# Patient Record
Sex: Female | Born: 1995 | Race: White | Hispanic: No | Marital: Married | Smoking: Never smoker
Health system: Southern US, Community
[De-identification: ages and names within clinical notes are randomized; demographics above are authoritative.]

## PROBLEM LIST (undated history)

## (undated) DIAGNOSIS — K219 Gastro-esophageal reflux disease without esophagitis: Secondary | ICD-10-CM

## (undated) DIAGNOSIS — F909 Attention-deficit hyperactivity disorder, unspecified type: Secondary | ICD-10-CM

## (undated) DIAGNOSIS — E079 Disorder of thyroid, unspecified: Secondary | ICD-10-CM

## (undated) HISTORY — DX: Attention-deficit hyperactivity disorder, unspecified type: F90.9

## (undated) HISTORY — DX: Gastro-esophageal reflux disease without esophagitis: K21.9

## (undated) HISTORY — PX: WISDOM TOOTH EXTRACTION: SHX21

## (undated) HISTORY — DX: Disorder of thyroid, unspecified: E07.9

---
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
## 2015-03-07 ENCOUNTER — Emergency Department (HOSPITAL_COMMUNITY)
Admission: EM | Admit: 2015-03-07 | Discharge: 2015-03-07 | Disposition: A | Discharge status: Home / Self Care | Payer: BLUE CROSS/BLUE SHIELD | Attending: Emergency Medicine | Admitting: Emergency Medicine

## 2015-03-07 ENCOUNTER — Encounter (HOSPITAL_COMMUNITY): Payer: Self-pay

## 2015-03-07 ENCOUNTER — Emergency Department (HOSPITAL_COMMUNITY): Payer: BLUE CROSS/BLUE SHIELD

## 2015-03-07 DIAGNOSIS — Y92410 Unspecified street and highway as the place of occurrence of the external cause: Secondary | ICD-10-CM | POA: Insufficient documentation

## 2015-03-07 DIAGNOSIS — Y998 Other external cause status: Secondary | ICD-10-CM | POA: Diagnosis not present

## 2015-03-07 DIAGNOSIS — S8992XA Unspecified injury of left lower leg, initial encounter: Secondary | ICD-10-CM | POA: Diagnosis not present

## 2015-03-07 DIAGNOSIS — S80811A Abrasion, right lower leg, initial encounter: Secondary | ICD-10-CM | POA: Diagnosis not present

## 2015-03-07 DIAGNOSIS — Y9389 Activity, other specified: Secondary | ICD-10-CM | POA: Diagnosis not present

## 2015-03-07 DIAGNOSIS — S29001A Unspecified injury of muscle and tendon of front wall of thorax, initial encounter: Secondary | ICD-10-CM | POA: Diagnosis not present

## 2015-03-07 DIAGNOSIS — T148XXA Other injury of unspecified body region, initial encounter: Secondary | ICD-10-CM

## 2015-03-07 DIAGNOSIS — R0789 Other chest pain: Secondary | ICD-10-CM

## 2015-03-07 MED ORDER — IBUPROFEN 600 MG PO TABS: 600.0000 mg | ORAL_TABLET | Freq: Three times a day (TID) | ORAL | Status: DC
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
## 2015-03-07 NOTE — Discharge Instructions (Signed)
Abrasion °An abrasion is a cut or scrape of the skin. Abrasions do not extend through all layers of the skin and most heal within 10 days. It is important to care for your abrasion properly to prevent infection. °CAUSES  °Most abrasions are caused by falling on, or gliding across, the ground or other surface. When your skin rubs on something, the outer and inner layer of skin rubs off, causing an abrasion. °DIAGNOSIS  °Your caregiver will be able to diagnose an abrasion during a physical exam.  °TREATMENT  °Your treatment depends on how large and deep the abrasion is. Generally, your abrasion will be cleaned with water and a mild soap to remove any dirt or debris. An antibiotic ointment may be put over the abrasion to prevent an infection. A bandage (dressing) may be wrapped around the abrasion to keep it from getting dirty.  °You may need a tetanus shot if: °· You cannot remember when you had your last tetanus shot. °· You have never had a tetanus shot. °· The injury broke your skin. °If you get a tetanus shot, your arm may swell, get red, and feel warm to the touch. This is common and not a problem. If you need a tetanus shot and you choose not to have one, there is a rare chance of getting tetanus. Sickness from tetanus can be serious.  °HOME CARE INSTRUCTIONS  °· If a dressing was applied, change it at least once a day or as directed by your caregiver. If the bandage sticks, soak it off with warm water.   °· Wash the area with water and a mild soap to remove all the ointment 2 times a day. Rinse off the soap and pat the area dry with a clean towel.   °· Reapply any ointment as directed by your caregiver. This will help prevent infection and keep the bandage from sticking. Use gauze over the wound and under the dressing to help keep the bandage from sticking.   °· Change your dressing right away if it becomes wet or dirty.   °· Only take over-the-counter or prescription medicines for pain, discomfort, or fever as  directed by your caregiver.   °· Follow up with your caregiver within 24-48 hours for a wound check, or as directed. If you were not given a wound-check appointment, look closely at your abrasion for redness, swelling, or pus. These are signs of infection. °SEEK IMMEDIATE MEDICAL CARE IF:  °· You have increasing pain in the wound.   °· You have redness, swelling, or tenderness around the wound.   °· You have pus coming from the wound.   °· You have a fever or persistent symptoms for more than 2-3 days. °· You have a fever and your symptoms suddenly get worse. °· You have a bad smell coming from the wound or dressing.   °MAKE SURE YOU:  °· Understand these instructions. °· Will watch your condition. °· Will get help right away if you are not doing well or get worse. °Document Released: 05/25/2005 Document Revised: 08/01/2012 Document Reviewed: 07/19/2011 °ExitCare® Patient Information ©2015 [employer]. This information is not intended to replace advice given to you by your health care provider. Make sure you discuss any questions you have with your health care provider. °Motor Vehicle Collision °It is common to have multiple bruises and sore muscles after a motor vehicle collision (MVC). These tend to feel worse for the first 24 hours. You may have the most stiffness and soreness over the first several hours. You may also feel   worse when you wake up the first morning after your collision. After this point, you will usually begin to improve with each day. The speed of improvement often depends on the severity of the collision, the number of injuries, and the location and nature of these injuries. °HOME CARE INSTRUCTIONS °· Put ice on the injured area. °¨ Put ice in a plastic bag. °¨ Place a towel between your skin and the bag. °¨ Leave the ice on for 15-20 minutes, 3-4 times a day, or as directed by your health care provider. °· Drink enough fluids to keep your urine clear or pale yellow. Do not drink  alcohol. °· Take a warm shower or bath once or twice a day. This will increase blood flow to sore muscles. °· You may return to activities as directed by your caregiver. Be careful when lifting, as this may aggravate neck or back pain. °· Only take over-the-counter or prescription medicines for pain, discomfort, or fever as directed by your caregiver. Do not use aspirin. This may increase bruising and bleeding. °SEEK IMMEDIATE MEDICAL CARE IF: °· You have numbness, tingling, or weakness in the arms or legs. °· You develop severe headaches not relieved with medicine. °· You have severe neck pain, especially tenderness in the middle of the back of your neck. °· You have changes in bowel or bladder control. °· There is increasing pain in any area of the body. °· You have shortness of breath, light-headedness, dizziness, or fainting. °· You have chest pain. °· You feel sick to your stomach (nauseous), throw up (vomit), or sweat. °· You have increasing abdominal discomfort. °· There is blood in your urine, stool, or vomit. °· You have pain in your shoulder (shoulder strap areas). °· You feel your symptoms are getting worse. °MAKE SURE YOU: °· Understand these instructions. °· Will watch your condition. °· Will get help right away if you are not doing well or get worse. °Document Released: 08/15/2005 Document Revised: 12/30/2013 Document Reviewed: 01/12/2011 °ExitCare® Patient Information ©2015 [employer]. This information is not intended to replace advice given to you by your health care provider. Make sure you discuss any questions you have with your health care provider. ° °

## 2015-03-07 NOTE — ED Notes (Signed)
Pt ambulatory on discharge. Verbalizes understanding of d/c instructions. A/O x4. VSS.
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
## 2015-03-07 NOTE — ED Notes (Signed)
Pt back from x-ray.

## 2015-03-14 ENCOUNTER — Ambulatory Visit (INDEPENDENT_AMBULATORY_CARE_PROVIDER_SITE_OTHER): Payer: BLUE CROSS/BLUE SHIELD | Admitting: Family Medicine

## 2015-03-14 DIAGNOSIS — S20212A Contusion of left front wall of thorax, initial encounter: Secondary | ICD-10-CM | POA: Diagnosis not present

## 2015-03-14 DIAGNOSIS — S8001XA Contusion of right knee, initial encounter: Secondary | ICD-10-CM | POA: Diagnosis not present
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
## 2015-03-14 NOTE — Patient Instructions (Signed)
Continue the ibuprofen. It's reasonable to consult a chiropractor because of all the strain on her ribs and chest.  I've written a note for you to be out another week until these strains and contusions can heel

## 2015-06-29 ENCOUNTER — Encounter: Payer: Self-pay | Admitting: Primary Care

## 2015-06-29 ENCOUNTER — Encounter (INDEPENDENT_AMBULATORY_CARE_PROVIDER_SITE_OTHER): Payer: Self-pay

## 2015-06-29 ENCOUNTER — Ambulatory Visit (INDEPENDENT_AMBULATORY_CARE_PROVIDER_SITE_OTHER): Payer: BLUE CROSS/BLUE SHIELD | Admitting: Primary Care

## 2015-06-29 VITALS — BP 112/68 | HR 89 | Temp 98.2°F | Ht 62.0 in | Wt 120.4 lb

## 2015-06-29 DIAGNOSIS — Z7689 Persons encountering health services in other specified circumstances: Secondary | ICD-10-CM

## 2015-06-29 DIAGNOSIS — Z7189 Other specified counseling: Secondary | ICD-10-CM | POA: Diagnosis not present

## 2015-06-29 NOTE — Patient Instructions (Signed)
I recommend taking Women's One-A-Day Vitamins for everyday health.  Please schedule a physical with me at your convenience in the next several months.  It was a pleasure to meet you today! Please don't hesitate to call me with any questions. Welcome to [facility]!

## 2015-06-29 NOTE — Progress Notes (Signed)
   Subjective:    Patient ID: Amber Murillo, female    DOB: 01/28/1996, 19 y.o.   MRN: [id number]  HPI  Ms. Amber Murillo is a 19 year old female who presents today to establish care. Her last physical was several years ago. She see's her GYN annually and last visit was in April 2016. She has no complaints today.   Review of Systems  Constitutional: Negative for unexpected weight change.  HENT: Negative for rhinorrhea.   Respiratory: Negative for cough and shortness of breath.   Cardiovascular: Negative for chest pain.  Gastrointestinal: Negative for diarrhea and constipation.  Genitourinary: Negative for difficulty urinating.       Regular periods  Musculoskeletal: Negative for myalgias and arthralgias.  Skin: Negative for rash.       Will get heat rash after running.   Allergic/Immunologic: Positive for environmental allergies.  Neurological: Negative for dizziness, numbness and headaches.  Psychiatric/Behavioral:       Denies concerns for anxiety or depression       No past medical history on file.  Social History   Social History  . Marital Status: Single    Spouse Name: N/A  . Number of Children: N/A  . Years of Education: N/A   Occupational History  . Not on file.   Social History Main Topics  . Smoking status: Never Smoker   . Smokeless tobacco: Never Used  . Alcohol Use: No  . Drug Use: No  . Sexual Activity: Yes    Birth Control/ Protection: Pill   Other Topics Concern  . Not on file   Social History Narrative   Single.   No children.   Works as a Theme park manager.   Is in school at [employer] for PT.   Enjoys playing softball and riding horses.     No past surgical history on file.  No family history on file.  No Known Allergies  Current Outpatient Prescriptions on File Prior to Visit  Medication Sig Dispense Refill  . ibuprofen (ADVIL,MOTRIN) 600 MG tablet Take 1 tablet (600 mg total) by mouth 3 (three) times daily. 15 tablet 0  . Norethindrone  Acetate-Ethinyl Estradiol (JUNEL 1.5/30) 1.5-30 MG-MCG tablet Take 1 tablet by mouth daily.     No current facility-administered medications on file prior to visit.    BP 112/68 mmHg  Pulse 89  Temp(Src) 98.2 F (36.8 C) (Oral)  Ht 5' 2"  (1.575 m)  Wt 120 lb 6.4 oz (54.613 kg)  BMI 22.02 kg/m2  SpO2 98%  LMP 06/29/2015    Objective:   Physical Exam  Constitutional: She is oriented to person, place, and time. She appears well-nourished.  Cardiovascular: Normal rate and regular rhythm.   Pulmonary/Chest: Effort normal and breath sounds normal.  Neurological: She is alert and oriented to person, place, and time.  Skin: Skin is warm and dry.  Psychiatric: She has a normal mood and affect.          Assessment & Plan:  Establish Care:  19 year old female presents today to establish care. Overall healthy girl, no PMH or significant FH. Last physical was several years ago and she follows with GYN annually. Discussed to see me for physical in the next several months.

## 2015-06-29 NOTE — Progress Notes (Signed)
Pre visit review using our clinic review tool, if applicable. No additional management support is needed unless otherwise documented below in the visit note. 

## 2015-07-01 ENCOUNTER — Encounter: Payer: Self-pay | Admitting: Primary Care

## 2015-07-06 ENCOUNTER — Encounter: Payer: Self-pay | Admitting: Internal Medicine

## 2015-07-06 ENCOUNTER — Ambulatory Visit (INDEPENDENT_AMBULATORY_CARE_PROVIDER_SITE_OTHER): Payer: BLUE CROSS/BLUE SHIELD | Admitting: Internal Medicine

## 2015-07-06 VITALS — BP 124/78 | HR 110 | Temp 98.0°F | Wt 118.0 lb

## 2015-07-06 DIAGNOSIS — J0100 Acute maxillary sinusitis, unspecified: Secondary | ICD-10-CM | POA: Diagnosis not present

## 2015-07-06 MED ORDER — AMOXICILLIN 500 MG PO CAPS: 500.0000 mg | ORAL_CAPSULE | Freq: Three times a day (TID) | ORAL | Status: DC

## 2015-07-06 NOTE — Patient Instructions (Signed)

## 2015-07-06 NOTE — Progress Notes (Signed)
HPI  Pt presents to the clinic today with c/o headache, facial pain and pressure, nasal congestion, scratchy throat and cough. She reports this started 2 days ago. She is blowing green mucous out of her nose. The cough is nonproductive. She has run fever up to 101.9. She has taken Ibuprofen without much relief. She does have a history of seasonal allergies. She takes Zyrtec as needed. She has not had sick contacts.  Review of Systems   History reviewed. No pertinent past medical history.  History reviewed. No pertinent family history.  Social History   Social History  . Marital Status: Single    Spouse Name: N/A  . Number of Children: N/A  . Years of Education: N/A   Occupational History  . Not on file.   Social History Main Topics  . Smoking status: Never Smoker   . Smokeless tobacco: Never Used  . Alcohol Use: No  . Drug Use: No  . Sexual Activity: Yes    Birth Control/ Protection: Pill   Other Topics Concern  . Not on file   Social History Narrative   Single.   No children.   Works as a Theme park manager.   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   Enjoys playing softball and riding horses.     No Known Allergies   Constitutional: Positive headache, fatigue and fever. Denies abrupt weight changes.  HEENT:  Positive eye pain, facial pain, nasal congestion and sore throat. Denies eye redness, ear pain, ringing in the ears, wax buildup, runny nose or bloody nose. Respiratory: Positive cough. Denies difficulty breathing or shortness of breath.  Cardiovascular: Denies chest pain, chest tightness, palpitations or swelling in the hands or feet.   No other specific complaints in a complete review of systems (except as listed in HPI above).  Objective:   BP 124/78 mmHg  Pulse 110  Temp(Src) 98 F (36.7 C) (Oral)  Wt 118 lb (53.524 kg)  SpO2 98%  LMP 06/29/2015  General: Appears her stated age, well developed, well nourished in NAD. HEENT: Head: normal shape and size,  maxillary sinus tenderness noted; Eyes: sclera white, no icterus, conjunctiva pink; Ears: Tm's gray and intact, normal light reflex; Nose: mucosa boggy and moist, septum midline; Throat/Mouth: + PND. Teeth present, mucosa erythematous and moist, no exudate noted, no lesions or ulcerations noted.  Neck:  Cervical adenopathy noted.  Cardiovascular: Normal rate and rhythm. S1,S2 noted.  No murmur, rubs or gallops noted.  Pulmonary/Chest: Normal effort and positive vesicular breath sounds. No respiratory distress. No wheezes, rales or ronchi noted.      Assessment & Plan:   Acute viral sinusitis  Can use a Neti Pot which can be purchased from your local drug store. Flonase 2 sprays each nostril for 3 days and then as needed. Take Zyrtec daily Written RX for Amoxil 500 mg TID to start on Friday if no improvement  RTC as needed or if symptoms persist.

## 2015-07-06 NOTE — Progress Notes (Signed)
Pre visit review using our clinic review tool, if applicable. No additional management support is needed unless otherwise documented below in the visit note. 

## 2015-07-31 ENCOUNTER — Encounter: Payer: Self-pay | Admitting: Primary Care

## 2015-08-11 ENCOUNTER — Encounter: Payer: Self-pay | Admitting: Primary Care

## 2015-08-11 ENCOUNTER — Telehealth: Payer: Self-pay | Admitting: Primary Care

## 2015-08-11 ENCOUNTER — Ambulatory Visit (INDEPENDENT_AMBULATORY_CARE_PROVIDER_SITE_OTHER): Payer: BLUE CROSS/BLUE SHIELD | Admitting: Primary Care

## 2015-08-11 VITALS — BP 118/78 | HR 103 | Temp 97.9°F | Ht 62.0 in | Wt 121.0 lb

## 2015-08-11 DIAGNOSIS — R41840 Attention and concentration deficit: Secondary | ICD-10-CM

## 2015-08-11 DIAGNOSIS — F909 Attention-deficit hyperactivity disorder, unspecified type: Secondary | ICD-10-CM | POA: Insufficient documentation

## 2015-08-11 NOTE — Progress Notes (Signed)
   Subjective:    Patient ID: Amber Murillo, female    DOB: 10/27/1995, 19 y.o.   MRN: [id number]  HPI  Amber Murillo is a 19 year old female who presents today to discuss ADHD medication. She was diagnosed in first grade. She was on "low dose adderall" during first through fifth grade. She did well in middle and high school without medications. Over the past 2 semesters in college she's had little ability to concentrate, she's unable to complete homework and studying, and has increased test anxiety.  Her grades have been C's, D's, and F's. She failed her chemistry class this semester. Denies daily worry, sadness, tearfulness. She is interested in going back on medication.  Review of Systems  Cardiovascular: Negative for chest pain.       Occasional palpitations during exams.  Psychiatric/Behavioral: Positive for decreased concentration. Negative for suicidal ideas and sleep disturbance. The patient is not nervous/anxious.        No past medical history on file.  Social History   Social History  . Marital Status: Single    Spouse Name: N/A  . Number of Children: N/A  . Years of Education: N/A   Occupational History  . Not on file.   Social History Main Topics  . Smoking status: Never Smoker   . Smokeless tobacco: Never Used  . Alcohol Use: No  . Drug Use: No  . Sexual Activity: Yes    Birth Control/ Protection: Pill   Other Topics Concern  . Not on file   Social History Narrative   Single.   No children.   Works as a Theme park manager.   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   Enjoys playing softball and riding horses.     No past surgical history on file.  No family history on file.  No Known Allergies  Current Outpatient Prescriptions on File Prior to Visit  Medication Sig Dispense Refill  . ibuprofen (ADVIL,MOTRIN) 600 MG tablet Take 1 tablet (600 mg total) by mouth 3 (three) times daily. 15 tablet 0  . Norethindrone Acetate-Ethinyl Estradiol (JUNEL 1.5/30) 1.5-30 MG-MCG  tablet Take 1 tablet by mouth daily.    . Probiotic Product (PROBIOTIC DAILY) CAPS Take 1 capsule by mouth at bedtime.     No current facility-administered medications on file prior to visit.    BP 118/78 mmHg  Pulse 103  Temp(Src) 97.9 F (36.6 C) (Oral)  Ht 5' 2"  (1.575 m)  Wt 121 lb (54.885 kg)  BMI 22.13 kg/m2  SpO2 99%  LMP 07/31/2015    Objective:   Physical Exam  Constitutional: She appears well-nourished.  Cardiovascular: Normal rate and regular rhythm.   Pulmonary/Chest: Effort normal and breath sounds normal.  Skin: Skin is warm and dry.  Psychiatric: She has a normal mood and affect.  Tearful as she is upset from failing her class this semester.          Assessment & Plan:

## 2015-08-11 NOTE — Assessment & Plan Note (Signed)
History of ADHD as a child. Managed on "low dose adderall" during first through fifth grade, no meds since. Decreased concentration, test anxiety, failing tests, failed 1 class this semester.  Is interested in going back on meds. Does not appear to have underlying anxiety and depression. Will send her to psychology for formal testing and recommendations. Will treat accordingly.

## 2015-08-11 NOTE — Progress Notes (Signed)
Pre visit review using our clinic review tool, if applicable. No additional management support is needed unless otherwise documented below in the visit note. 

## 2015-08-11 NOTE — Telephone Encounter (Signed)
Patient called me to ask if she can get a Dr's note from you. Basically, her Financial Aid is in jeopardy because of her bad grades. [employer] told her to obtain a Dr's note stating the plan that you have for your patient and how it will affect and improve her grades with your plan. I sent a message to [facility] to schedule with Dr Irven Shelling. They were sending a message to Dr Glennon Hamilton to ask if she would see her urgently. If you have any questiond you can reach the patient at [phone]. She can come by to pick up her letter from you. Please call patient for more details.

## 2015-08-11 NOTE — Patient Instructions (Signed)
Stop by the front and speak with Rosaria Ferries regarding your referral to [facility] for ADHD testing.  Please call me once you've completed testing so I can ensure we get you initiated on the correct medication promptly.   It was a pleasure to see you today!

## 2015-08-11 NOTE — Telephone Encounter (Signed)
Noted. Discussed with patient and will work on Engineer, building services.

## 2015-09-11 ENCOUNTER — Ambulatory Visit (INDEPENDENT_AMBULATORY_CARE_PROVIDER_SITE_OTHER): Payer: BLUE CROSS/BLUE SHIELD | Admitting: Psychology

## 2015-09-11 DIAGNOSIS — F4322 Adjustment disorder with anxiety: Secondary | ICD-10-CM

## 2015-09-15 ENCOUNTER — Encounter: Payer: Self-pay | Admitting: Primary Care

## 2015-09-21 ENCOUNTER — Other Ambulatory Visit: Payer: Self-pay | Admitting: Primary Care

## 2015-09-21 ENCOUNTER — Encounter: Payer: Self-pay | Admitting: Primary Care

## 2015-09-21 DIAGNOSIS — F900 Attention-deficit hyperactivity disorder, predominantly inattentive type: Secondary | ICD-10-CM

## 2015-09-21 MED ORDER — AMPHETAMINE-DEXTROAMPHET ER 10 MG PO CP24: 10.0000 mg | ORAL_CAPSULE | ORAL | Status: DC

## 2015-09-23 ENCOUNTER — Encounter: Payer: Self-pay | Admitting: Primary Care

## 2015-10-01 ENCOUNTER — Ambulatory Visit (INDEPENDENT_AMBULATORY_CARE_PROVIDER_SITE_OTHER): Payer: BLUE CROSS/BLUE SHIELD | Admitting: Psychology

## 2015-10-01 DIAGNOSIS — F902 Attention-deficit hyperactivity disorder, combined type: Secondary | ICD-10-CM

## 2015-10-13 ENCOUNTER — Other Ambulatory Visit: Payer: Self-pay | Admitting: *Deleted

## 2015-10-13 DIAGNOSIS — F900 Attention-deficit hyperactivity disorder, predominantly inattentive type: Secondary | ICD-10-CM

## 2015-10-13 MED ORDER — AMPHETAMINE-DEXTROAMPHET ER 10 MG PO CP24: 10.0000 mg | ORAL_CAPSULE | ORAL | Status: DC

## 2015-10-13 NOTE — Telephone Encounter (Signed)
Called patient and notified her that prescription is ready for pick up. Left in front office.

## 2015-10-13 NOTE — Telephone Encounter (Signed)
Last filled #30 09/21/15 (first prescription).  Follow up scheduled for 10/22/15.  Okay to refill?

## 2015-10-18 ENCOUNTER — Encounter: Payer: Self-pay | Admitting: Primary Care

## 2015-10-20 ENCOUNTER — Encounter: Payer: Self-pay | Admitting: Primary Care

## 2015-10-20 ENCOUNTER — Ambulatory Visit (INDEPENDENT_AMBULATORY_CARE_PROVIDER_SITE_OTHER): Payer: BLUE CROSS/BLUE SHIELD | Admitting: Primary Care

## 2015-10-20 VITALS — BP 116/76 | HR 93 | Temp 97.9°F | Ht 62.0 in | Wt 121.4 lb

## 2015-10-20 DIAGNOSIS — F908 Attention-deficit hyperactivity disorder, other type: Secondary | ICD-10-CM | POA: Diagnosis not present

## 2015-10-20 DIAGNOSIS — N939 Abnormal uterine and vaginal bleeding, unspecified: Secondary | ICD-10-CM

## 2015-10-20 LAB — POCT URINE PREGNANCY: Preg Test, Ur: NEGATIVE

## 2015-10-20 NOTE — Patient Instructions (Addendum)
Please notify me if you continue to bleed by Wednesday next week. Start your new pack of birth control pills as scheduled this Sunday.  Continue your Adderall XR tablets for ADHD.   Follow up in 3 months for re-evaluation.  It was a pleasure to see you today!

## 2015-10-20 NOTE — Progress Notes (Signed)
Subjective:    Patient ID: Amber Murillo, female    DOB: April 17, 1996, 20 y.o.   MRN: [id number]  HPI  Amber Murillo is a 20 year old female who presents today for follow up of ADHD and a chief complaint of vaginal bleeding.  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 It was also suggested that her IQ was slightly below goal during her testing with psychology. She has always had a difficult time in school as a child. She was initiated on Adderall XR 10 mg tablets one month ago for her new diagnosis. Since her last visit she's felt significantly improved. He has more attention and concentration with her school work and is able to complete assignments. Denies palpitations, chest pain, dizziness.   2) Vaginal Bleeding: Last normal period began 09/18/15 and ended about 7 days later. Her bleeding began again on 10/09/15 with light spotting and has persisted to a regular period since. Her prior LMP began on 09/18/15 and lasted for 7 days. She's not missed any pills of her birth control pack. Denies pain, increased cramping, passing clots, fatigue, weakness, dizziness, nausea/vomiting, vaginal bleeding. This has never occurred in the past and she's not taken a home pregnancy test.   Review of Systems  Constitutional: Negative for fever.  Respiratory: Negative for shortness of breath.   Cardiovascular: Negative for chest pain.  Genitourinary: Positive for vaginal bleeding and menstrual problem. Negative for dysuria, frequency, vaginal discharge, vaginal pain and pelvic pain.  Neurological: Negative for dizziness and weakness.  Psychiatric/Behavioral: Negative for decreased concentration. The patient is not nervous/anxious.        No past medical history on file.  Social History   Social History  . Marital Status: Single    Spouse Name: N/A  . Number of Children: N/A  . Years of Education: N/A   Occupational History  . Not on file.   Social History Main Topics   . Smoking status: Never Smoker   . Smokeless tobacco: Never Used  . Alcohol Use: No  . Drug Use: No  . Sexual Activity: Yes    Birth Control/ Protection: Pill   Other Topics Concern  . Not on file   Social History Narrative   Single.   No children.   Works as a Theme park manager.   Is in school at [employer] for PT.   Enjoys playing softball and riding horses.     No past surgical history on file.  No family history on file.  No Known Allergies  Current Outpatient Prescriptions on File Prior to Visit  Medication Sig Dispense Refill  . amphetamine-dextroamphetamine (ADDERALL XR) 10 MG 24 hr capsule Take 1 capsule (10 mg total) by mouth every morning. 30 capsule 0  . ibuprofen (ADVIL,MOTRIN) 600 MG tablet Take 1 tablet (600 mg total) by mouth 3 (three) times daily. 15 tablet 0  . Probiotic Product (PROBIOTIC DAILY) CAPS Take 1 capsule by mouth at bedtime.    . Norethindrone Acetate-Ethinyl Estradiol (JUNEL 1.5/30) 1.5-30 MG-MCG tablet Take 1 tablet by mouth daily. Reported on 10/20/2015     No current facility-administered medications on file prior to visit.    BP 116/76 mmHg  Pulse 93  Temp(Src) 97.9 F (36.6 C) (Oral)  Ht 5' 2"  (1.575 m)  Wt 121 lb 6.4 oz (55.067 kg)  BMI 22.20 kg/m2  SpO2 99%  LMP 10/09/2015    Objective:   Physical Exam  Constitutional: She appears well-nourished.  Cardiovascular: Normal  rate and regular rhythm.   Pulmonary/Chest: Effort normal and breath sounds normal.  Abdominal: Soft. Bowel sounds are normal. There is no tenderness.  Skin: Skin is warm and dry. No pallor.  Psychiatric: She has a normal mood and affect.          Assessment & Plan:  Vaginal Bleeding:  Since 10/09/15 and continuing. No cramping, passing clots, weakness, abdominal pain. No missed pills. LMP was 09/18/15 and was normal for patient. She is currently on her scheduled menstrual cycle now and will start back on pills Sunday this week. Will have her start pills as  scheduled and then notify me if her bleeding continues. Urine pregnancy negative today. If continues, then will consider ultrasound. Does not appear weak or anemic today. Will continue to monitor. This is not a known side effect of her newly prescribed Adderall.

## 2015-10-20 NOTE — Addendum Note (Signed)
Addended by: Jacqualin Combes on: 10/20/2015 09:34 AM   Modules accepted: Orders

## 2015-10-20 NOTE — Assessment & Plan Note (Signed)
Formally tested and determined to be in partial remission. Testing also revealed decrease in IQ. Currently managed on Adderall XR 10 mg and feels much improved. Continue same, follow up in May 2017.

## 2015-10-20 NOTE — Progress Notes (Signed)
Pre visit review using our clinic review tool, if applicable. No additional management support is needed unless otherwise documented below in the visit note. 

## 2015-10-22 ENCOUNTER — Ambulatory Visit: Payer: BLUE CROSS/BLUE SHIELD | Admitting: Primary Care

## 2015-10-30 ENCOUNTER — Encounter: Payer: Self-pay | Admitting: Primary Care

## 2015-11-16 ENCOUNTER — Other Ambulatory Visit: Payer: Self-pay | Admitting: Primary Care

## 2015-11-16 DIAGNOSIS — F909 Attention-deficit hyperactivity disorder, unspecified type: Secondary | ICD-10-CM

## 2015-11-16 MED ORDER — AMPHETAMINE-DEXTROAMPHET ER 10 MG PO CP24: 10.0000 mg | ORAL_CAPSULE | Freq: Every day | ORAL | Status: DC

## 2015-11-16 NOTE — Telephone Encounter (Signed)
Electronically refill request for   amphetamine-dextroamphetamine (ADDERALL XR) 10 MG 24 hr capsule   Take 1 capsule (10 mg total) by mouth every morning.  Dispense: 30 capsule   Refills: 0     Last prescribed on  10/13/2015. Last seen on 10/20/2015. CPE on 12/28/2015.

## 2015-11-17 ENCOUNTER — Encounter: Payer: Self-pay | Admitting: Primary Care

## 2015-12-03 ENCOUNTER — Encounter: Payer: Self-pay | Admitting: Primary Care

## 2015-12-04 ENCOUNTER — Encounter: Payer: Self-pay | Admitting: Primary Care

## 2015-12-14 ENCOUNTER — Other Ambulatory Visit: Payer: Self-pay | Admitting: Primary Care

## 2015-12-14 DIAGNOSIS — F909 Attention-deficit hyperactivity disorder, unspecified type: Secondary | ICD-10-CM

## 2015-12-17 ENCOUNTER — Other Ambulatory Visit: Payer: Self-pay | Admitting: Primary Care

## 2015-12-17 NOTE — Telephone Encounter (Signed)
Electronically refill request for    amphetamine-dextroamphetamine (ADDERALL XR) 10 MG 24 hr capsule  Take 1 capsule (10 mg total) by mouth daily.  Dispense: 30    Refill:  0  Last prescribed on 11/16/2015. Last seen on 10/19/2014. CPE on 01/12/2016.

## 2015-12-22 ENCOUNTER — Encounter: Payer: Self-pay | Admitting: Primary Care

## 2015-12-23 ENCOUNTER — Other Ambulatory Visit: Payer: BLUE CROSS/BLUE SHIELD

## 2015-12-28 ENCOUNTER — Encounter: Payer: BLUE CROSS/BLUE SHIELD | Admitting: Primary Care

## 2015-12-29 ENCOUNTER — Encounter: Payer: BLUE CROSS/BLUE SHIELD | Admitting: Primary Care

## 2016-01-12 ENCOUNTER — Encounter: Payer: Self-pay | Admitting: Primary Care

## 2016-01-12 ENCOUNTER — Ambulatory Visit (INDEPENDENT_AMBULATORY_CARE_PROVIDER_SITE_OTHER): Payer: BLUE CROSS/BLUE SHIELD | Admitting: Primary Care

## 2016-01-12 VITALS — BP 116/64 | HR 91 | Temp 98.0°F | Ht 62.0 in | Wt 119.8 lb

## 2016-01-12 DIAGNOSIS — Z0000 Encounter for general adult medical examination without abnormal findings: Secondary | ICD-10-CM

## 2016-01-12 DIAGNOSIS — F908 Attention-deficit hyperactivity disorder, other type: Secondary | ICD-10-CM

## 2016-01-12 DIAGNOSIS — R21 Rash and other nonspecific skin eruption: Secondary | ICD-10-CM | POA: Diagnosis not present

## 2016-01-12 DIAGNOSIS — Z0001 Encounter for general adult medical examination with abnormal findings: Secondary | ICD-10-CM

## 2016-01-12 DIAGNOSIS — Z23 Encounter for immunization: Secondary | ICD-10-CM | POA: Diagnosis not present

## 2016-01-12 LAB — COMPREHENSIVE METABOLIC PANEL
ALT: 14 U/L (ref 0–35)
AST: 20 U/L (ref 0–37)
Albumin: 4.2 g/dL (ref 3.5–5.2)
Alkaline Phosphatase: 54 U/L (ref 39–117)
BUN: 10 mg/dL (ref 6–23)
CO2: 28 mEq/L (ref 19–32)
Calcium: 9.4 mg/dL (ref 8.4–10.5)
Chloride: 104 mEq/L (ref 96–112)
Creatinine, Ser: 0.77 mg/dL (ref 0.40–1.20)
GFR: 100.03 mL/min (ref >60.00)
Glucose, Bld: 73 mg/dL (ref 70–99)
Potassium: 3.7 mEq/L (ref 3.5–5.1)
Sodium: 139 mEq/L (ref 135–145)
Total Bilirubin: 0.6 mg/dL (ref 0.2–1.2)
Total Protein: 7 g/dL (ref 6.0–8.3)

## 2016-01-12 LAB — TSH: TSH: 1.49 u[IU]/mL (ref 0.35–4.50)

## 2016-01-12 NOTE — Progress Notes (Signed)
Subjective:    Patient ID: Amber Murillo, female    DOB: 04/05/1996, 20 y.o.   MRN: [id number]  HPI  Amber Murillo is a 20 year old female who presents today for complete physical.  Immunizations: -Tetanus: Completed in 2007, due today. -Influenza: Did not receive last season.  Diet: She endorses a healthy diet. Breakfast: Oatmeal with fruit Lunch: Sandwich, chips, left overs Dinner: Protein, vegetables, starches Snacks: Protein bars, fruit Desserts: Occasional Beverages: Water, occasionally sweet tea, rare sodas.  Exercise: She does exercise some days with running, walking.  Eye exam: Completed several years ago. Denies changes in vision.  Dental exam: Completes semi-annually. Pap Smear: Scheduled for June 2017 per GYN.   Review of Systems  Constitutional: Negative for unexpected weight change.  HENT: Negative for rhinorrhea.   Respiratory: Negative for cough and shortness of breath.   Cardiovascular: Negative for chest pain.  Gastrointestinal: Negative for diarrhea and constipation.  Genitourinary: Negative for difficulty urinating.       Regular periods  Musculoskeletal: Negative for myalgias and arthralgias.  Skin:       Breaks out in rash with exercising outdoors, appointment with allergy clinic in June  Allergic/Immunologic: Positive for environmental allergies.  Neurological: Negative for dizziness, numbness and headaches.  Psychiatric/Behavioral: Negative for suicidal ideas and decreased concentration. The patient is not nervous/anxious.        Denies anxiety and depression       No past medical history on file.   Social History   Social History  . Marital Status: Single    Spouse Name: N/A  . Number of Children: N/A  . Years of Education: N/A   Occupational History  . Not on file.   Social History Main Topics  . Smoking status: Never Smoker   . Smokeless tobacco: Never Used  . Alcohol Use: No  . Drug Use: No  . Sexual Activity: Yes    Birth  Control/ Protection: Pill   Other Topics Concern  . Not on file   Social History Narrative   Single.   No children.   Works as a Theme park manager.   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   Enjoys playing softball and riding horses.     No past surgical history on file.  No family history on file.  No Known Allergies  Current Outpatient Prescriptions on File Prior to Visit  Medication Sig Dispense Refill  . amphetamine-dextroamphetamine (ADDERALL XR) 10 MG 24 hr capsule TAKE ONE CAPSULE BY MOUTH EVERY MORNING 30 capsule 0  . amphetamine-dextroamphetamine (ADDERALL XR) 10 MG 24 hr capsule Take 1 capsule (10 mg total) by mouth daily. 30 capsule 0  . ibuprofen (ADVIL,MOTRIN) 600 MG tablet Take 1 tablet (600 mg total) by mouth 3 (three) times daily. 15 tablet 0  . Norethindrone Acetate-Ethinyl Estradiol (JUNEL 1.5/30) 1.5-30 MG-MCG tablet Take 1 tablet by mouth daily. Reported on 10/20/2015    . Probiotic Product (PROBIOTIC DAILY) CAPS Take 1 capsule by mouth at bedtime.     No current facility-administered medications on file prior to visit.    BP 116/64 mmHg  Pulse 91  Temp(Src) 98 F (36.7 C) (Oral)  Ht 5' 2"  (1.575 m)  Wt 119 lb 12.8 oz (54.341 kg)  BMI 21.91 kg/m2  SpO2 99%  LMP 01/09/2016    Objective:   Physical Exam  Constitutional: She is oriented to person, place, and time. She appears well-nourished.  HENT:  Right Ear: Tympanic membrane and ear canal normal.  Left Ear: Tympanic membrane and ear canal normal.  Nose: Nose normal.  Mouth/Throat: Oropharynx is clear and moist.  Eyes: Conjunctivae and EOM are normal. Pupils are equal, round, and reactive to light.  Neck: Neck supple. No thyromegaly present.  Cardiovascular: Normal rate and regular rhythm.   No murmur heard. Pulmonary/Chest: Effort normal and breath sounds normal. She has no rales.  Abdominal: Soft. Bowel sounds are normal. There is no tenderness.  Musculoskeletal: Normal range of motion.    Lymphadenopathy:    She has no cervical adenopathy.  Neurological: She is alert and oriented to person, place, and time. She has normal reflexes. No cranial nerve deficit.  Skin: Skin is warm and dry. No rash noted.  Psychiatric: She has a normal mood and affect.          Assessment & Plan:

## 2016-01-12 NOTE — Progress Notes (Signed)
Pre visit review using our clinic review tool, if applicable. No additional management support is needed unless otherwise documented below in the visit note. 

## 2016-01-12 NOTE — Patient Instructions (Addendum)
Stop Zyrtec. Start Xyzal for the rash and allergy symptoms.  Follow up with the Allergy Clinic as scheduled.  You've been provided with a tetanus vaccination which will cover you for 10 years.   Ensure you are consuming 64 ounces of water daily.  Continue exercising. You should be getting 1 hour of moderate intensity exercise 5 days weekly.  Complete lab work prior to leaving today. I will notify you of your results once received.   Call or e-mail me when you need a refill of your Adderall in August.   Follow up in 1 year for repeat physical or sooner if needed.  It was a pleasure to see you today!

## 2016-01-12 NOTE — Assessment & Plan Note (Signed)
Made A's and B's this past semester. Discussed Adderall use and she will refrain from taking this summer as she is not taking classes.  Will resume in August 2017.

## 2016-01-12 NOTE — Assessment & Plan Note (Signed)
With urticaria. Occurs when running/exercising outdoors.  Will have her stop Zyrtec, start Xyzal. She has an appointment with the allergy clinic in June.

## 2016-01-12 NOTE — Addendum Note (Signed)
Addended by: Jacqualin Combes on: 01/12/2016 04:19 PM   Modules accepted: Orders, SmartSet

## 2016-01-12 NOTE — Assessment & Plan Note (Signed)
Tdap due and provided today. Pap scheduled with GYN for June. Fair diet overall. Discussed to increase intake of water and vegetables. Continue exercising.  Exam unremarkable. Labs pending. Follow up in 1 year for repeat physical.

## 2016-01-29 ENCOUNTER — Encounter: Payer: Self-pay | Admitting: Primary Care

## 2016-02-02 ENCOUNTER — Encounter: Payer: Self-pay | Admitting: Primary Care

## 2016-02-02 DIAGNOSIS — J309 Allergic rhinitis, unspecified: Secondary | ICD-10-CM | POA: Diagnosis not present

## 2016-02-02 DIAGNOSIS — J3081 Allergic rhinitis due to animal (cat) (dog) hair and dander: Secondary | ICD-10-CM | POA: Diagnosis not present

## 2016-02-02 DIAGNOSIS — J3089 Other allergic rhinitis: Secondary | ICD-10-CM | POA: Diagnosis not present

## 2016-02-02 DIAGNOSIS — L509 Urticaria, unspecified: Secondary | ICD-10-CM | POA: Diagnosis not present

## 2016-02-02 DIAGNOSIS — H1045 Other chronic allergic conjunctivitis: Secondary | ICD-10-CM | POA: Diagnosis not present

## 2016-02-04 ENCOUNTER — Encounter: Payer: Self-pay | Admitting: Primary Care

## 2016-02-10 DIAGNOSIS — Z113 Encounter for screening for infections with a predominantly sexual mode of transmission: Secondary | ICD-10-CM | POA: Diagnosis not present

## 2016-02-10 DIAGNOSIS — Z6823 Body mass index (BMI) 23.0-23.9, adult: Secondary | ICD-10-CM | POA: Diagnosis not present

## 2016-02-10 DIAGNOSIS — Z01419 Encounter for gynecological examination (general) (routine) without abnormal findings: Secondary | ICD-10-CM | POA: Diagnosis not present

## 2016-04-01 ENCOUNTER — Other Ambulatory Visit: Payer: Self-pay | Admitting: Primary Care

## 2016-04-01 ENCOUNTER — Encounter: Payer: Self-pay | Admitting: Primary Care

## 2016-04-01 ENCOUNTER — Other Ambulatory Visit: Payer: Self-pay

## 2016-04-01 DIAGNOSIS — F909 Attention-deficit hyperactivity disorder, unspecified type: Secondary | ICD-10-CM

## 2016-04-01 MED ORDER — AMPHETAMINE-DEXTROAMPHET ER 10 MG PO CP24: 10.0000 mg | ORAL_CAPSULE | Freq: Every day | ORAL | 0 refills | Status: DC

## 2016-04-01 NOTE — Telephone Encounter (Signed)
Message left for patient to return my call.  

## 2016-04-01 NOTE — Telephone Encounter (Signed)
Pt left v/m requesting rx for Adderall. Call when ready for pick up. rx last printed # 30 x 1 on 11/16/15. Annual exam on 01/12/16.

## 2016-04-04 NOTE — Telephone Encounter (Signed)
Spoken and notified patient that prescription is ready for pick up.

## 2016-06-08 ENCOUNTER — Ambulatory Visit (HOSPITAL_COMMUNITY)
Admission: EM | Admit: 2016-06-08 | Discharge: 2016-06-08 | Discharge status: Absent without leave | Payer: BLUE CROSS/BLUE SHIELD

## 2016-06-09 ENCOUNTER — Ambulatory Visit: Payer: BLUE CROSS/BLUE SHIELD

## 2016-06-09 ENCOUNTER — Ambulatory Visit (INDEPENDENT_AMBULATORY_CARE_PROVIDER_SITE_OTHER): Payer: BLUE CROSS/BLUE SHIELD | Admitting: Family Medicine

## 2016-06-09 ENCOUNTER — Encounter: Payer: Self-pay | Admitting: Family Medicine

## 2016-06-09 VITALS — BP 108/80 | HR 94 | Temp 98.0°F | Ht 61.0 in | Wt 126.0 lb

## 2016-06-09 DIAGNOSIS — J0100 Acute maxillary sinusitis, unspecified: Secondary | ICD-10-CM

## 2016-06-09 DIAGNOSIS — J00 Acute nasopharyngitis [common cold]: Secondary | ICD-10-CM | POA: Diagnosis not present

## 2016-06-09 MED ORDER — PREDNISONE 20 MG PO TABS: 20.0000 mg | ORAL_TABLET | Freq: Every day | ORAL | 0 refills | Status: DC

## 2016-06-09 NOTE — Progress Notes (Signed)
   Subjective:    Patient ID: Amber Murillo, female    DOB: [birth year], 20 y.o.   MRN: [id number]  HPI This is a 20 yo female who presents today with 6-7 days of frontal headache and pain under her eyes. Pain comes and goes. Has been taking tylenol and ibuprofen. Over last couple of days she has developed muscle aches. Little nasal drainage. Has been using neti pot. Good fluid intake. Has seasonal allergies, worse in the fall. Sore throat today. No known sick contacts. Takes Zyrtec daily and alternates with mucinex. Feels tender over sinuses. No cough, SOB or wheeze.  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    Past Medical History:  Diagnosis Date  . ADHD (attention deficit hyperactivity disorder)    No past surgical history on file. No family history on file. Social History  Substance Use Topics  . Smoking status: Never Smoker  . Smokeless tobacco: Never Used  . Alcohol use No      Review of Systems Per HPI    Objective:   Physical Exam  Constitutional: She is oriented to person, place, and time. She appears well-developed and well-nourished. No distress.  HENT:  Head: Normocephalic and atraumatic.  Right Ear: Tympanic membrane, external ear and ear canal normal.  Left Ear: Tympanic membrane, external ear and ear canal normal.  Nose: Mucosal edema and rhinorrhea present. Right sinus exhibits maxillary sinus tenderness. Right sinus exhibits no frontal sinus tenderness. Left sinus exhibits maxillary sinus tenderness. Left sinus exhibits no frontal sinus tenderness.  Mouth/Throat: Uvula is midline, oropharynx is clear and moist and mucous membranes are normal.  Sounds congested. Nares small.    Eyes: Conjunctivae are normal.  Neck: Normal range of motion. Neck supple.  Cardiovascular: Normal rate, regular rhythm and normal heart sounds.   Pulmonary/Chest: Effort normal and breath sounds normal.  Lymphadenopathy:    She has no cervical adenopathy.  Neurological: She  is alert and oriented to person, place, and time.  Skin: Skin is warm and dry. She is not diaphoretic.  Psychiatric: She has a normal mood and affect. Her behavior is normal. Judgment and thought content normal.  Vitals reviewed.     BP 108/80   Pulse 94   Temp 98 F (36.7 C)   Ht 5' 1"  (1.549 m)   Wt 126 lb (57.2 kg)   LMP 06/01/2016   SpO2 98%   BMI 23.81 kg/m  Wt Readings from Last 3 Encounters:  06/09/16 126 lb (57.2 kg)  01/12/16 119 lb 12.8 oz (54.3 kg)  10/20/15 121 lb 6.4 oz (55.1 kg)       Assessment & Plan:  1. Acute maxillary sinusitis, recurrence not specified - likely viral in this 20 yo non smoking female with history of allergic rhinitis - hold Zyrtec for 3-4 days, may be contributing to overdrying, continue neti pot, can add Afrin x 3 days, sudafed - RTC precautions reviewed - predniSONE (DELTASONE) 20 MG tablet; Take 1 tablet (20 mg total) by mouth daily with breakfast.  Dispense: 5 tablet; Refill: 0   Clarene Reamer, FNP-BC  [facility]  06/09/2016 2:52 PM

## 2016-06-09 NOTE — Patient Instructions (Signed)
Hold your Zyrtec for 3-4 days Continue Neti pot several times a day Can use Afrin two sprays in each nostril at bedtime as needed No ibuprofen while on Prednisone   Sinusitis, Adult Sinusitis is redness, soreness, and inflammation of the paranasal sinuses. Paranasal sinuses are air pockets within the bones of your face. They are located beneath your eyes, in the middle of your forehead, and above your eyes. In healthy paranasal sinuses, mucus is able to drain out, and air is able to circulate through them by way of your nose. However, when your paranasal sinuses are inflamed, mucus and air can become trapped. This can allow bacteria and other germs to grow and cause infection. Sinusitis can develop quickly and last only a short time (acute) or continue over a long period (chronic). Sinusitis that lasts for more than 12 weeks is considered chronic. CAUSES Causes of sinusitis include:  Allergies.  Structural abnormalities, such as displacement of the cartilage that separates your nostrils (deviated septum), which can decrease the air flow through your nose and sinuses and affect sinus drainage.  Functional abnormalities, such as when the small hairs (cilia) that line your sinuses and help remove mucus do not work properly or are not present. SIGNS AND SYMPTOMS Symptoms of acute and chronic sinusitis are the same. The primary symptoms are pain and pressure around the affected sinuses. Other symptoms include:  Upper toothache.  Earache.  Headache.  Bad breath.  Decreased sense of smell and taste.  A cough, which worsens when you are lying flat.  Fatigue.  Fever.  Thick drainage from your nose, which often is green and may contain pus (purulent).  Swelling and warmth over the affected sinuses. DIAGNOSIS Your health care provider will perform a physical exam. During your exam, your health care provider may perform any of the following to help determine if you have acute sinusitis or  chronic sinusitis:  Look in your nose for signs of abnormal growths in your nostrils (nasal polyps).  Tap over the affected sinus to check for signs of infection.  View the inside of your sinuses using an imaging device that has a light attached (endoscope). If your health care provider suspects that you have chronic sinusitis, one or more of the following tests may be recommended:  Allergy tests.  Nasal culture. A sample of mucus is taken from your nose, sent to a lab, and screened for bacteria.  Nasal cytology. A sample of mucus is taken from your nose and examined by your health care provider to determine if your sinusitis is related to an allergy. TREATMENT Most cases of acute sinusitis are related to a viral infection and will resolve on their own within 10 days. Sometimes, medicines are prescribed to help relieve symptoms of both acute and chronic sinusitis. These may include pain medicines, decongestants, nasal steroid sprays, or saline sprays. However, for sinusitis related to a bacterial infection, your health care provider will prescribe antibiotic medicines. These are medicines that will help kill the bacteria causing the infection. Rarely, sinusitis is caused by a fungal infection. In these cases, your health care provider will prescribe antifungal medicine. For some cases of chronic sinusitis, surgery is needed. Generally, these are cases in which sinusitis recurs more than 3 times per year, despite other treatments. HOME CARE INSTRUCTIONS  Drink plenty of water. Water helps thin the mucus so your sinuses can drain more easily.  Use a humidifier.  Inhale steam 3-4 times a day (for example, sit in the bathroom  with the shower running).  Apply a warm, moist washcloth to your face 3-4 times a day, or as directed by your health care provider.  Use saline nasal sprays to help moisten and clean your sinuses.  Take medicines only as directed by your health care provider.  If  you were prescribed either an antibiotic or antifungal medicine, finish it all even if you start to feel better. SEEK IMMEDIATE MEDICAL CARE IF:  You have increasing pain or severe headaches.  You have nausea, vomiting, or drowsiness.  You have swelling around your face.  You have vision problems.  You have a stiff neck.  You have difficulty breathing.   This information is not intended to replace advice given to you by your health care provider. Make sure you discuss any questions you have with your health care provider.   Document Released: 08/15/2005 Document Revised: 09/05/2014 Document Reviewed: 08/30/2011 Elsevier Interactive Patient Education [employer].

## 2016-06-13 ENCOUNTER — Encounter: Payer: Self-pay | Admitting: Family Medicine

## 2016-06-15 ENCOUNTER — Encounter: Payer: Self-pay | Admitting: Family Medicine

## 2016-08-08 DIAGNOSIS — R350 Frequency of micturition: Secondary | ICD-10-CM | POA: Diagnosis not present

## 2016-08-09 ENCOUNTER — Ambulatory Visit: Payer: Self-pay | Admitting: Primary Care

## 2016-08-15 ENCOUNTER — Encounter (HOSPITAL_COMMUNITY): Payer: Self-pay | Admitting: Emergency Medicine

## 2016-08-15 ENCOUNTER — Emergency Department (HOSPITAL_COMMUNITY)
Admission: EM | Admit: 2016-08-15 | Discharge: 2016-08-16 | Disposition: A | Discharge status: Home / Self Care | Payer: BLUE CROSS/BLUE SHIELD | Attending: Emergency Medicine | Admitting: Emergency Medicine

## 2016-08-15 ENCOUNTER — Emergency Department (HOSPITAL_COMMUNITY): Payer: BLUE CROSS/BLUE SHIELD

## 2016-08-15 DIAGNOSIS — R0789 Other chest pain: Secondary | ICD-10-CM | POA: Diagnosis not present

## 2016-08-15 DIAGNOSIS — R05 Cough: Secondary | ICD-10-CM | POA: Diagnosis not present

## 2016-08-15 DIAGNOSIS — F909 Attention-deficit hyperactivity disorder, unspecified type: Secondary | ICD-10-CM | POA: Diagnosis not present

## 2016-08-15 DIAGNOSIS — R079 Chest pain, unspecified: Secondary | ICD-10-CM

## 2016-08-15 DIAGNOSIS — R0602 Shortness of breath: Secondary | ICD-10-CM | POA: Insufficient documentation

## 2016-08-15 DIAGNOSIS — S0302XA Dislocation of jaw, left side, initial encounter: Secondary | ICD-10-CM | POA: Diagnosis not present

## 2016-08-15 LAB — I-STAT TROPONIN, ED: Troponin i, poc: 0 ng/mL (ref 0.00–0.08)

## 2016-08-15 LAB — CBC
HCT: 41.2 % (ref 36.0–46.0)
Hemoglobin: 14.5 g/dL (ref 12.0–15.0)
MCH: 31 pg (ref 26.0–34.0)
MCHC: 35.2 g/dL (ref 30.0–36.0)
MCV: 88.2 fL (ref 78.0–100.0)
Platelets: 325 10*3/uL (ref 150–400)
RBC: 4.67 MIL/uL (ref 3.87–5.11)
RDW: 12.2 % (ref 11.5–15.5)
WBC: 15.4 10*3/uL — ABNORMAL HIGH (ref 4.0–10.5)

## 2016-08-15 LAB — BASIC METABOLIC PANEL
Anion gap: 11 (ref 5–15)
BUN: 13 mg/dL (ref 6–20)
CO2: 24 mmol/L (ref 22–32)
Calcium: 10.1 mg/dL (ref 8.9–10.3)
Chloride: 103 mmol/L (ref 101–111)
Creatinine, Ser: 0.76 mg/dL (ref 0.44–1.00)
GFR calc Af Amer: >60 mL/min (ref >60)
GFR calc non Af Amer: >60 mL/min (ref >60)
Glucose, Bld: 85 mg/dL (ref 65–99)
Potassium: 3.6 mmol/L (ref 3.5–5.1)
Sodium: 138 mmol/L (ref 135–145)

## 2016-08-15 LAB — D-DIMER, QUANTITATIVE: D-Dimer, Quant: 0.58 ug/mL-FEU — ABNORMAL HIGH (ref 0.00–0.50)

## 2016-08-15 NOTE — ED Triage Notes (Signed)
Patient with chest pain and shortness of breath.  Patient states she has pain with deep breath.  She does have a productive cough and she states that the pain radiates from center of chest to left chest and arm.  NAD, CAOx4. No vomiting, but she does have some nausea.

## 2016-08-15 NOTE — ED Provider Notes (Signed)
[place] DEPT Provider Note   CSN: [id number] Arrival date & time: 08/15/16  1848     History   Chief Complaint Chief Complaint  Patient presents with  . Chest Pain  . Shortness of Breath    HPI Amber Murillo is a 20 y.o. female.  The history is provided by the patient and a parent. No language interpreter was used.    This is a 20 year old female with past medical history of ADHD who presents today with 1 day of worsening shortness of breath and chest pain. Describes the chest pain as pressure-like. Denies any previous similar symptoms. No prior history of blood clots. No history of heart disease. Does relate that she's had recent sinus congestion over the last week. No fevers but she has had chills. Has not had any nausea, vomiting, diarrhea. He has not any abdominal pain. She is on birth control. Denies any unilateral leg swelling. Came in as she was very concerned about the level of pain that she is having and no prior similar symptoms. She denies worsening with exertion. No alleviating factors.  Past Medical History:  Diagnosis Date  . ADHD (attention deficit hyperactivity disorder)     Patient Active Problem List   Diagnosis Date Noted  . Preventative health care 01/12/2016  . Rash and nonspecific skin eruption 01/12/2016  . ADHD (attention deficit hyperactivity disorder) 08/11/2015    History reviewed. No pertinent surgical history.  OB History    No data available       Home Medications    Prior to Admission medications   Medication Sig Start Date End Date Taking? Authorizing Provider  amphetamine-dextroamphetamine (ADDERALL XR) 10 MG 24 hr capsule Take 1 capsule (10 mg total) by mouth daily. 04/01/16  Yes [name], NP  cetirizine (ZYRTEC) 10 MG tablet Take 10 mg by mouth daily as needed for allergies.   Yes Historical Provider, MD  ibuprofen (ADVIL,MOTRIN) 200 MG tablet Take 200-400 mg by mouth every 6 (six) hours as needed for headache (or  pain).   Yes Historical Provider, MD  JUNEL 1/20 1-20 MG-MCG tablet Take 1 tablet by mouth daily. 08/02/16  Yes Historical Provider, MD  Multiple Vitamins-Calcium (ONE-A-DAY WOMENS FORMULA PO) Take 1 tablet by mouth daily.   Yes Historical Provider, MD  Probiotic Product (PROBIOTIC DAILY) CAPS Take 1 capsule by mouth at bedtime.   Yes Historical Provider, MD  ibuprofen (ADVIL,MOTRIN) 600 MG tablet Take 1 tablet (600 mg total) by mouth 3 (three) times daily. Patient not taking: Reported on 08/16/2016 03/07/15   Glendell Docker, NP  predniSONE (DELTASONE) 20 MG tablet Take 1 tablet (20 mg total) by mouth daily with breakfast. Patient not taking: Reported on 08/16/2016 06/09/16   Elby Beck, FNP    Family History History reviewed. No pertinent family history.  Social History Social History  Substance Use Topics  . Smoking status: Never Smoker  . Smokeless tobacco: Never Used  . Alcohol use No     Allergies   Patient has no known allergies.   Review of Systems Review of Systems  Constitutional: Negative for chills and fever.  HENT: Negative for ear pain and sore throat.   Eyes: Negative for pain and visual disturbance.  Respiratory: Positive for shortness of breath. Negative for cough.   Cardiovascular: Positive for chest pain (describes as heaviness). Negative for palpitations and leg swelling.  Gastrointestinal: Negative for abdominal pain and vomiting.  Genitourinary: Negative for dysuria and hematuria.  Musculoskeletal: Negative for arthralgias  and back pain.  Skin: Negative for color change and rash.  Neurological: Negative for seizures and syncope.  Psychiatric/Behavioral: Negative for agitation and confusion.  All other systems reviewed and are negative.    Physical Exam Updated Vital Signs BP 139/92   Pulse 90   Temp 98.6 F (37 C) (Oral)   Resp 22   Wt 56.7 kg   LMP 08/09/2016 (Approximate)   SpO2 98%   BMI 23.62 kg/m   Physical Exam  Constitutional:  She is oriented to person, place, and time. No distress.  HENT:  Head: Normocephalic and atraumatic.  Eyes: Conjunctivae and EOM are normal.  Neck: Normal range of motion. Neck supple.  Cardiovascular: Regular rhythm.  Tachycardia present.   Pulmonary/Chest: Effort normal. No respiratory distress. She has no wheezes. She has no rales.  Abdominal: Soft. She exhibits no distension. There is no tenderness.  Musculoskeletal: Normal range of motion. She exhibits no edema.  Neurological: She is alert and oriented to person, place, and time.  Skin: Skin is warm. She is not diaphoretic.  Nursing note and vitals reviewed.    ED Treatments / Results  Labs (all labs ordered are listed, but only abnormal results are displayed) Labs Reviewed  CBC - Abnormal; Notable for the following:       Result Value   WBC 15.4 (*)    All other components within normal limits  D-DIMER, QUANTITATIVE (NOT AT [facility]) - Abnormal; Notable for the following:    D-Dimer, Quant 0.58 (*)    All other components within normal limits  I-STAT BETA HCG BLOOD, ED ([facility], [facility], [facility] ONLY) - Abnormal; Notable for the following:    I-stat hCG, quantitative 13.1 (*)    All other components within normal limits  I-STAT BETA HCG BLOOD, ED ([facility], [facility], [facility] ONLY) - Abnormal; Notable for the following:    I-stat hCG, quantitative 6.4 (*)    All other components within normal limits  BASIC METABOLIC PANEL  HCG, SERUM, QUALITATIVE  I-STAT TROPOININ, ED  I-STAT TROPOININ, ED    EKG  EKG Interpretation None       Radiology Dg Chest 2 View  Result Date: 08/15/2016 CLINICAL DATA:  20 y/o F; central to left-sided chest pain with shortness of breath and productive cough. EXAM: CHEST  2 VIEW COMPARISON:  03/07/2015 chest radiograph. FINDINGS: The heart size and mediastinal contours are within normal limits and stable. Both lungs are clear. The visualized skeletal structures are unremarkable. IMPRESSION: No active cardiopulmonary disease.  Electronically Signed   By: Kristine Garbe M.D.   On: 08/15/2016 20:52   Ct Angio Chest Pe W And/or Wo Contrast  Result Date: 08/16/2016 CLINICAL DATA:  Anterior midchest pain, onset at 14:00 with dyspnea. EXAM: CT ANGIOGRAPHY CHEST WITH CONTRAST TECHNIQUE: Multidetector CT imaging of the chest was performed using the standard protocol during bolus administration of intravenous contrast. Multiplanar CT image reconstructions and MIPs were obtained to evaluate the vascular anatomy. CONTRAST:  100 mL Isovue 370 intravenous COMPARISON:  None. FINDINGS: Cardiovascular: Satisfactory opacification of the pulmonary arteries to the segmental level. No evidence of pulmonary embolism. Normal heart size. No pericardial effusion. Mediastinum/Nodes: No enlarged mediastinal, hilar, or axillary lymph nodes. Thyroid gland, trachea, and esophagus demonstrate no significant findings. Lungs/Pleura: Lungs are clear. Airways are patent and normal in caliber. No pleural effusion or pneumothorax. Upper Abdomen: No acute abnormality. Musculoskeletal: No chest wall abnormality. No acute or significant osseous findings. Review of the MIP images confirms the above findings. IMPRESSION:  No significant abnormality. Electronically Signed   By: Andreas Newport M.D.   On: 08/16/2016 00:51    Procedures Reduction of dislocation -- L jaw dislocation Date/Time: 08/15/2016 11:11 PM Performed by: Theodosia Quay Authorized by: Theodosia Quay  Consent: Verbal consent obtained. Consent given by: patient Patient understanding: patient states understanding of the procedure being performed Patient identity confirmed: verbally with patient Patient tolerance: Patient tolerated the procedure well with no immediate complications Comments: L jaw successfully reduced using extra-oral method. Had immediate relief and full ROM of jaw has returned.     (including critical care time)  Medications Ordered in ED Medications  iopamidol  (ISOVUE-370) 76 % injection (100 mLs  Contrast Given 08/16/16 0018)  diphenhydrAMINE (BENADRYL) capsule 25 mg (25 mg Oral Given 08/16/16 0032)     Initial Impression / Assessment and Plan / ED Course  I have reviewed the triage vital signs and the nursing notes.  Pertinent labs & imaging results that were available during my care of the patient were reviewed by me and considered in my medical decision making (see chart for details).  Clinical Course     Patient is a 20 year old female with a past medical history as documented above who presents today with worsening shortness of breath and chest pain that started over the last 1 day. On my exam the patient is in no acute distress. Her EKG reveals normal sinus rhythm, no ST elevations or ST depressions. No evidence of right heart strain. Felt that the patient was low risk by well's criteria however she was not per negative given her use of exogenous estrogen.  Obtained a d-dimer that was positive. We will proceed with CTA. The patient and family are understanding and agreeable with this plan. She's never had IV dye contrast and has no known allergies to this.  Reviewed CTA of the chest. No evidence of PE or other obvious abnormalities. Given reassuring workup, feel she is appropriate for discharge. Reviewed findings with patient who is agreeable.   Discharged home in good condition.     Final Clinical Impressions(s) / ED Diagnoses   Final diagnoses:  Chest pain, unspecified type  Shortness of breath    New Prescriptions New Prescriptions   No medications on file     Theodosia Quay, MD 08/16/16 [id number]    Theodosia Quay, MD 08/16/16 2563    Gareth Morgan, MD 08/16/16 1253

## 2016-08-16 ENCOUNTER — Emergency Department (HOSPITAL_COMMUNITY): Payer: BLUE CROSS/BLUE SHIELD

## 2016-08-16 DIAGNOSIS — R079 Chest pain, unspecified: Secondary | ICD-10-CM | POA: Diagnosis not present

## 2016-08-16 LAB — I-STAT BETA HCG BLOOD, ED (MC, WL, AP ONLY)
I-stat hCG, quantitative: 13.1 m[IU]/mL — ABNORMAL HIGH (ref <5)
I-stat hCG, quantitative: 6.4 m[IU]/mL — ABNORMAL HIGH (ref <5)

## 2016-08-16 LAB — HCG, SERUM, QUALITATIVE: Preg, Serum: NEGATIVE

## 2016-08-16 LAB — I-STAT TROPONIN, ED: Troponin i, poc: 0 ng/mL (ref 0.00–0.08)

## 2016-08-16 MED ORDER — DIPHENHYDRAMINE HCL 25 MG PO CAPS
25.0000 mg | ORAL_CAPSULE | Freq: Once | ORAL | Status: AC
  Administered 2016-08-16: 25 mg via ORAL
  Filled 2016-08-16: qty 1

## 2016-08-16 MED ORDER — IOPAMIDOL (ISOVUE-370) INJECTION 76%
INTRAVENOUS | Status: AC
  Administered 2016-08-16: 100 mL
  Filled 2016-08-16: qty 100

## 2016-08-16 NOTE — ED Notes (Signed)
RN contacted CT and informed them of completed preg test

## 2016-08-27 ENCOUNTER — Emergency Department (HOSPITAL_COMMUNITY)
Admission: EM | Admit: 2016-08-27 | Discharge: 2016-08-27 | Disposition: A | Discharge status: Home / Self Care | Payer: BLUE CROSS/BLUE SHIELD | Attending: Emergency Medicine | Admitting: Emergency Medicine

## 2016-08-27 ENCOUNTER — Encounter (HOSPITAL_COMMUNITY): Payer: Self-pay | Admitting: Certified Nurse Midwife

## 2016-08-27 ENCOUNTER — Emergency Department (HOSPITAL_COMMUNITY): Payer: BLUE CROSS/BLUE SHIELD

## 2016-08-27 DIAGNOSIS — Z79899 Other long term (current) drug therapy: Secondary | ICD-10-CM | POA: Insufficient documentation

## 2016-08-27 DIAGNOSIS — R1011 Right upper quadrant pain: Secondary | ICD-10-CM

## 2016-08-27 DIAGNOSIS — K219 Gastro-esophageal reflux disease without esophagitis: Secondary | ICD-10-CM | POA: Insufficient documentation

## 2016-08-27 DIAGNOSIS — F909 Attention-deficit hyperactivity disorder, unspecified type: Secondary | ICD-10-CM | POA: Insufficient documentation

## 2016-08-27 LAB — URINALYSIS, ROUTINE W REFLEX MICROSCOPIC
Bilirubin Urine: NEGATIVE
Glucose, UA: NEGATIVE mg/dL
Hgb urine dipstick: NEGATIVE
Ketones, ur: 5 mg/dL — AB
Leukocytes, UA: NEGATIVE
Nitrite: NEGATIVE
Protein, ur: NEGATIVE mg/dL
Specific Gravity, Urine: 1.01 (ref 1.005–1.030)
pH: 7 (ref 5.0–8.0)

## 2016-08-27 LAB — COMPREHENSIVE METABOLIC PANEL
ALT: 17 U/L (ref 14–54)
AST: 21 U/L (ref 15–41)
Albumin: 3.7 g/dL (ref 3.5–5.0)
Alkaline Phosphatase: 58 U/L (ref 38–126)
Anion gap: 5 (ref 5–15)
BUN: 7 mg/dL (ref 6–20)
CO2: 29 mmol/L (ref 22–32)
Calcium: 9.3 mg/dL (ref 8.9–10.3)
Chloride: 105 mmol/L (ref 101–111)
Creatinine, Ser: 0.79 mg/dL (ref 0.44–1.00)
GFR calc Af Amer: >60 mL/min (ref >60)
GFR calc non Af Amer: >60 mL/min (ref >60)
Glucose, Bld: 85 mg/dL (ref 65–99)
Potassium: 3.4 mmol/L — ABNORMAL LOW (ref 3.5–5.1)
Sodium: 139 mmol/L (ref 135–145)
Total Bilirubin: 0.6 mg/dL (ref 0.3–1.2)
Total Protein: 7.2 g/dL (ref 6.5–8.1)

## 2016-08-27 LAB — CBC
HCT: 42.3 % (ref 36.0–46.0)
Hemoglobin: 14.7 g/dL (ref 12.0–15.0)
MCH: 31.1 pg (ref 26.0–34.0)
MCHC: 34.8 g/dL (ref 30.0–36.0)
MCV: 89.4 fL (ref 78.0–100.0)
Platelets: 348 10*3/uL (ref 150–400)
RBC: 4.73 MIL/uL (ref 3.87–5.11)
RDW: 12.2 % (ref 11.5–15.5)
WBC: 8 10*3/uL (ref 4.0–10.5)

## 2016-08-27 LAB — LIPASE, BLOOD: Lipase: 16 U/L (ref 11–51)

## 2016-08-27 MED ORDER — ONDANSETRON HCL 4 MG/2ML IJ SOLN
4.0000 mg | Freq: Once | INTRAMUSCULAR | Status: AC
  Administered 2016-08-27: 4 mg via INTRAVENOUS
  Filled 2016-08-27: qty 2

## 2016-08-27 MED ORDER — OMEPRAZOLE 20 MG PO CPDR: 20.0000 mg | DELAYED_RELEASE_CAPSULE | Freq: Every day | ORAL | 0 refills | Status: DC

## 2016-08-27 MED ORDER — SODIUM CHLORIDE 0.9 % IV BOLUS (SEPSIS)
1000.0000 mL | Freq: Once | INTRAVENOUS | Status: AC
  Administered 2016-08-27: 1000 mL via INTRAVENOUS

## 2016-08-27 MED ORDER — ONDANSETRON 4 MG PO TBDP: 4.0000 mg | ORAL_TABLET | Freq: Three times a day (TID) | ORAL | 0 refills | Status: DC | PRN

## 2016-08-27 NOTE — ED Provider Notes (Signed)
[place] DEPT Provider Note   CSN: [id number] Arrival date & time: 08/27/16  1330     History   Chief Complaint Chief Complaint  Patient presents with  . Abdominal Pain    HPI Amber Murillo is a 20 y.o. female.   Abdominal Pain   This is a recurrent problem. Episode onset: few days ago. The problem has been gradually worsening. The pain is associated with eating. The pain is located in the RUQ. The quality of the pain is aching. The pain is at a severity of 6/10. The pain is moderate. Associated symptoms include anorexia, diarrhea and nausea. Pertinent negatives include fever, hematochezia, melena, vomiting, dysuria, hematuria and headaches. The symptoms are aggravated by certain positions, palpation and eating.    Past Medical History:  Diagnosis Date  . ADHD (attention deficit hyperactivity disorder)     Patient Active Problem List   Diagnosis Date Noted  . Preventative health care 01/12/2016  . Rash and nonspecific skin eruption 01/12/2016  . ADHD (attention deficit hyperactivity disorder) 08/11/2015    History reviewed. No pertinent surgical history.  OB History    No data available       Home Medications    Prior to Admission medications   Medication Sig Start Date End Date Taking? Authorizing Provider  amphetamine-dextroamphetamine (ADDERALL XR) 10 MG 24 hr capsule Take 1 capsule (10 mg total) by mouth daily. 04/01/16   [name], NP  cetirizine (ZYRTEC) 10 MG tablet Take 10 mg by mouth daily as needed for allergies.    Historical Provider, MD  ibuprofen (ADVIL,MOTRIN) 200 MG tablet Take 200-400 mg by mouth every 6 (six) hours as needed for headache (or pain).    Historical Provider, MD  ibuprofen (ADVIL,MOTRIN) 600 MG tablet Take 1 tablet (600 mg total) by mouth 3 (three) times daily. Patient not taking: Reported on 08/16/2016 03/07/15   Glendell Docker, NP  JUNEL 1/20 1-20 MG-MCG tablet Take 1 tablet by mouth daily. 08/02/16   Historical  Provider, MD  Multiple Vitamins-Calcium (ONE-A-DAY WOMENS FORMULA PO) Take 1 tablet by mouth daily.    Historical Provider, MD  predniSONE (DELTASONE) 20 MG tablet Take 1 tablet (20 mg total) by mouth daily with breakfast. Patient not taking: Reported on 08/16/2016 06/09/16   Elby Beck, FNP  Probiotic Product (PROBIOTIC DAILY) CAPS Take 1 capsule by mouth at bedtime.    Historical Provider, MD    Family History No family history on file.  Social History Social History  Substance Use Topics  . Smoking status: Never Smoker  . Smokeless tobacco: Never Used  . Alcohol use No     Allergies   Patient has no known allergies.   Review of Systems Review of Systems  Constitutional: Positive for appetite change. Negative for chills and fever.  Respiratory: Negative for shortness of breath.   Cardiovascular: Negative for chest pain.  Gastrointestinal: Positive for abdominal pain, anorexia, diarrhea and nausea. Negative for blood in stool, hematochezia, melena and vomiting.  Genitourinary: Negative for dysuria, hematuria, pelvic pain, vaginal bleeding and vaginal discharge.  Musculoskeletal: Negative for back pain.  Skin: Negative for rash.  Neurological: Negative for headaches.  All other systems reviewed and are negative.    Physical Exam Updated Vital Signs BP 123/69   Pulse 95   Temp 98.2 F (36.8 C) (Oral)   Resp 16   Ht 5' 1"  (1.549 m)   Wt 54.4 kg   LMP 08/23/2016 (Exact Date)   SpO2 98%  BMI 22.67 kg/m   Physical Exam  Constitutional: She appears well-developed and well-nourished. No distress.  HENT:  Head: Normocephalic and atraumatic.  Mouth/Throat: Oropharynx is clear and moist.  Eyes: Conjunctivae are normal. No scleral icterus.  Neck: Neck supple.  Cardiovascular: Normal rate, regular rhythm and normal heart sounds.   No murmur heard. Pulmonary/Chest: Effort normal and breath sounds normal. No respiratory distress. She has no wheezes. She has no  rales.  Abdominal: Soft. Normal appearance and bowel sounds are normal. She exhibits no distension. There is tenderness in the right upper quadrant. There is no rigidity, no rebound, no guarding, no CVA tenderness and no tenderness at McBurney's point. Murphy's sign: equivical murphys. No hernia.  Musculoskeletal: She exhibits no edema.  Neurological: She is alert.  Skin: Skin is warm and dry. She is not diaphoretic.  Psychiatric: She has a normal mood and affect.  Nursing note and vitals reviewed.    ED Treatments / Results  Labs (all labs ordered are listed, but only abnormal results are displayed) Labs Reviewed  COMPREHENSIVE METABOLIC PANEL - Abnormal; Notable for the following:       Result Value   Potassium 3.4 (*)    All other components within normal limits  LIPASE, BLOOD  CBC  URINALYSIS, ROUTINE W REFLEX MICROSCOPIC    EKG  EKG Interpretation None       Radiology No results found.  Procedures Procedures (including critical care time)  Medications Ordered in ED Medications  sodium chloride 0.9 % bolus 1,000 mL (not administered)  ondansetron (ZOFRAN) injection 4 mg (not administered)     Initial Impression / Assessment and Plan / ED Course  I have reviewed the triage vital signs and the nursing notes.  Pertinent labs & imaging results that were available during my care of the patient were reviewed by me and considered in my medical decision making (see chart for details).  Clinical Course    Patient is a 20 year old female with history of ADHD who presents with 4 weeks of ongoing diarrheal illness, and several days of intermittent right upper quadrant pain worse after eating. Patient status post. No pain approximately 30 minutes after eating worse with greasy foods. Patient is noted greasy diarrhea as well. Patient has nausea postprandially as well but no recent vomiting.  On exam patient is mild right upper quadrant tenderness with no true Murphy's sign.  Abdomen is nonrigid. Patient currently complaining of mild nausea and pain 6/10. I explained that pain medication can skew the results of the RUQ [place] so pt opted to not take pain meds at this time. Zofran and IVF ordered. RUQ [place] ordered.  Suspect biliary colic versus PUD versus GERD.  Right upper quadrant ultrasound reveals no gallstones or signs of cholecystitis. Pain may be secondary to peptic ulcer disease. Will start patient on PPI and she can follow up with her PCP this coming Tuesday. Return precautions given. Next  Patient discharged in good condition.  Pt seen with attending Dr. Winfred Leeds.  Final Clinical Impressions(s) / ED Diagnoses   Final diagnoses:  RUQ pain    New Prescriptions New Prescriptions   No medications on file     Tobie Poet, DO 08/27/16 1825    Orlie Dakin, MD 08/28/16 [phone]

## 2016-08-27 NOTE — ED Notes (Signed)
Patient transported to Ultrasound 

## 2016-08-27 NOTE — ED Triage Notes (Signed)
Pt states she has had dull, cramping RUQ pain for over 2 weeks. Pt was here 1 week ago for the same complaint. Pt states she has had diarrhea for 1 week.

## 2016-08-27 NOTE — ED Notes (Signed)
Pt stable, ambulatory, states understanding of discharge instructions 

## 2016-08-27 NOTE — ED Provider Notes (Signed)
Percent with right-sided abdominal pain for one month. Pain is worse at right upper quadrant, worse after eating fried foods not as bad after eating salad. She vomited 4 days ago. She denies any lightheadedness she admits to diarrhea for one month. Reports approximate 8 episodes per day. On exam she is alert and in no distress lungs clear auscultation heart regular rate and rhythm abdomen nondistended normal active bowel sounds minimally tender at right upper quadrant no guarding rigidity or rebound   Orlie Dakin, MD 08/27/16 1656

## 2016-08-30 ENCOUNTER — Ambulatory Visit (INDEPENDENT_AMBULATORY_CARE_PROVIDER_SITE_OTHER): Payer: BLUE CROSS/BLUE SHIELD | Admitting: Primary Care

## 2016-08-30 ENCOUNTER — Encounter: Payer: Self-pay | Admitting: Primary Care

## 2016-08-30 VITALS — BP 122/80 | HR 95 | Temp 98.1°F | Ht 61.0 in | Wt 127.0 lb

## 2016-08-30 DIAGNOSIS — R1011 Right upper quadrant pain: Secondary | ICD-10-CM | POA: Diagnosis not present

## 2016-08-30 DIAGNOSIS — F909 Attention-deficit hyperactivity disorder, unspecified type: Secondary | ICD-10-CM | POA: Diagnosis not present

## 2016-08-30 MED ORDER — AMPHETAMINE-DEXTROAMPHET ER 10 MG PO CP24: 10.0000 mg | ORAL_CAPSULE | Freq: Every day | ORAL | 0 refills | Status: DC

## 2016-08-30 MED ORDER — OMEPRAZOLE 20 MG PO CPDR: 20.0000 mg | DELAYED_RELEASE_CAPSULE | Freq: Every day | ORAL | 0 refills | Status: DC

## 2016-08-30 NOTE — Patient Instructions (Signed)
Start omeprazole 20 mg tablets. Take 1 tablet by mouth once daily for 4 weeks.  Stop by the front desk and speak with either Rosaria Ferries or Shirlean Mylar regarding your HIDA scan.  I will be in touch with you once I receive these results.  It was a pleasure to see you today!

## 2016-08-30 NOTE — Progress Notes (Signed)
Subjective:    Patient ID: Amber Murillo, female    DOB: 15-Jun-1996, 21 y.o.   MRN: [id number]  HPI  Amber Murillo is a 21 year old female who presents today for Emergency Department Follow Up.  She presented to [facility] on 08/15/16 with a chief complaint of chest pain and shortness of breath. This had been ongoing for 1 week with associated symptoms of sinus congestion. She is managed on birth control.  During her stay in the ED she underwent ECG (NSR, No ST elevation or depression, no right heart strain), D-Dimer (positive), CTA of chest (negative for PE), CBC (elevated WBC count of 15.4). Given her reassuring exam and testing she was sent home with strict return precautions.  Since that visit she's feeling better overall. She does experience epigastric pain more than chest pain. She denies chest pain, shortness of breath, palpitations, fatigue.  She presented to [facility] on 08/27/16 with a chief complaint of abdominal pain. Her pain was associated with eating, diarrhea, anorexia, nausea: located to the RUQ. Her symptoms had been present for the past 4 weeks. During her stay in the ED she underwent testing including RUQ [place] (negative for cholelithiasis or acute cholecystitis), lab work (no anemia or leukocytosis). She was suspected to have PUD vs biliary colic and was recommended to start PPI. She was discharged home later that day.  Since that visit she's not started the PPI. She continues to experience RUQ abdominal and epigastric pain with radiation through to her back. She's feeling about the same. She will experience pain shortly after eating with diarrhea. Vomiting occurs 1-2 times weekly on average. She denies fevers, bloody stools, RLQ abdominal pain.  Review of Systems  Constitutional: Negative for chills, diaphoresis, fatigue and fever.  Respiratory: Negative for shortness of breath.   Cardiovascular: Negative for chest pain and palpitations.  Gastrointestinal: Positive for abdominal pain,  diarrhea, nausea and vomiting. Negative for blood in stool.  Neurological: Negative for weakness.       Past Medical History:  Diagnosis Date  . ADHD (attention deficit hyperactivity disorder)      Social History   Social History  . Marital status: Single    Spouse name: N/A  . Number of children: N/A  . Years of education: N/A   Occupational History  . Not on file.   Social History Main Topics  . Smoking status: Never Smoker  . Smokeless tobacco: Never Used  . Alcohol use No  . Drug use: No  . Sexual activity: Yes    Birth control/ protection: Pill   Other Topics Concern  . Not on file   Social History Narrative   Single.   No children.   Works as a Theme park manager.   Is in school at [employer] for PT.   Enjoys playing softball and riding horses.     No past surgical history on file.  No family history on file.  No Known Allergies  Current Outpatient Prescriptions on File Prior to Visit  Medication Sig Dispense Refill  . JUNEL 1/20 1-20 MG-MCG tablet Take 1 tablet by mouth daily.    . Multiple Vitamins-Calcium (ONE-A-DAY WOMENS FORMULA PO) Take 1 tablet by mouth daily.    . cetirizine (ZYRTEC) 10 MG tablet Take 10 mg by mouth daily as needed for allergies.    Marland Kitchen ibuprofen (ADVIL,MOTRIN) 200 MG tablet Take 200-400 mg by mouth every 6 (six) hours as needed for headache (or pain).    Marland Kitchen ibuprofen (ADVIL,MOTRIN) 600 MG  tablet Take 1 tablet (600 mg total) by mouth 3 (three) times daily. (Patient not taking: Reported on 08/30/2016) 15 tablet 0  . ondansetron (ZOFRAN ODT) 4 MG disintegrating tablet Take 1 tablet (4 mg total) by mouth every 8 (eight) hours as needed for nausea or vomiting. (Patient not taking: Reported on 08/30/2016) 12 tablet 0  . predniSONE (DELTASONE) 20 MG tablet Take 1 tablet (20 mg total) by mouth daily with breakfast. (Patient not taking: Reported on 08/30/2016) 5 tablet 0  . Probiotic Product (PROBIOTIC DAILY) CAPS Take 1 capsule by mouth at bedtime.     No  current facility-administered medications on file prior to visit.     BP 122/80   Pulse 95   Temp 98.1 F (36.7 C) (Oral)   Ht 5' 1"  (1.549 m)   Wt 127 lb (57.6 kg)   LMP 08/23/2016 (Exact Date)   SpO2 99%   BMI 24.00 kg/m    Objective:   Physical Exam  Constitutional: She appears well-nourished.  Neck: Neck supple.  Cardiovascular: Normal rate and regular rhythm.   Pulmonary/Chest: Effort normal and breath sounds normal.  Abdominal: Soft. Normal appearance and bowel sounds are normal. There is tenderness in the right upper quadrant, right lower quadrant and epigastric area. There is no rebound, no guarding, no CVA tenderness and negative Murphy's sign.  Skin: Skin is warm and dry.          Assessment & Plan:  Emergency Department Follow Up:  08/15/16: Presented to [facility] with complaints of SOB and chest pain. Work up unremarkable in the ED without evidence of PE. Overall she's feeling better with the exception of epigastric and RUQ abdominal pain. Exam today unremarkable, discussed return precautions.  08/27/16: Presented to [facility] with RUQ and epigastric abdominal pain, n/v/d.. Work up including labs and imaging unremarkable in the ED. Diagnosed with PUD or biliary colic. Exam today with tenderness to RUQ and epigastric region. Mild tenderness to RLQ which suspect is referred pain. No signs of acute appendicitis. Given negative [place] of RUQ, will obtain HIDA scan for further evaluation. Vitals stable, does not appear acutely ill. She is stable for outpatient treatment. Will await results.  All hospital notes, imaging, and labs reviewed.  Sheral Flow, NP

## 2016-08-30 NOTE — Assessment & Plan Note (Signed)
Only taking meds during school year. Refill provided today. Due for UDS next visit.

## 2016-08-30 NOTE — Progress Notes (Signed)
Pre visit review using our clinic review tool, if applicable. No additional management support is needed unless otherwise documented below in the visit note. 

## 2016-09-05 ENCOUNTER — Encounter (HOSPITAL_COMMUNITY): Payer: Self-pay

## 2016-09-05 ENCOUNTER — Encounter: Payer: Self-pay | Admitting: Primary Care

## 2016-09-05 ENCOUNTER — Encounter (HOSPITAL_COMMUNITY)
Admission: RE | Admit: 2016-09-05 | Discharge: 2016-09-05 | Disposition: A | Discharge status: Home / Self Care | Payer: BLUE CROSS/BLUE SHIELD | Source: Ambulatory Visit | Attending: Primary Care | Admitting: Primary Care

## 2016-09-05 DIAGNOSIS — R1011 Right upper quadrant pain: Secondary | ICD-10-CM | POA: Insufficient documentation

## 2016-09-05 MED ORDER — TECHNETIUM TC 99M MEBROFENIN IV KIT
5.2000 | PACK | Freq: Once | INTRAVENOUS | Status: AC | PRN
  Administered 2016-09-05: 5.2 via INTRAVENOUS

## 2016-09-07 ENCOUNTER — Encounter: Payer: Self-pay | Admitting: Primary Care

## 2016-10-08 ENCOUNTER — Encounter: Payer: Self-pay | Admitting: Primary Care

## 2016-10-23 ENCOUNTER — Encounter: Payer: Self-pay | Admitting: Primary Care

## 2016-11-12 ENCOUNTER — Encounter: Payer: Self-pay | Admitting: Primary Care

## 2016-11-17 ENCOUNTER — Ambulatory Visit (INDEPENDENT_AMBULATORY_CARE_PROVIDER_SITE_OTHER): Payer: BLUE CROSS/BLUE SHIELD | Admitting: Podiatrist

## 2016-11-17 ENCOUNTER — Ambulatory Visit (INDEPENDENT_AMBULATORY_CARE_PROVIDER_SITE_OTHER): Payer: BLUE CROSS/BLUE SHIELD

## 2016-11-17 ENCOUNTER — Encounter: Payer: Self-pay | Admitting: Podiatrist

## 2016-11-17 VITALS — BP 117/74 | HR 73 | Resp 16

## 2016-11-17 DIAGNOSIS — M722 Plantar fascial fibromatosis: Secondary | ICD-10-CM | POA: Diagnosis not present

## 2016-11-17 MED ORDER — MELOXICAM 15 MG PO TABS: 15.0000 mg | ORAL_TABLET | Freq: Every day | ORAL | 3 refills | Status: DC

## 2016-11-17 NOTE — Progress Notes (Signed)
  Chief Complaint  Patient presents with  . Foot Pain    Plantar heel and lateral side left - aching for 6-8 months, worse in the last 4 months, AM pain, takes Ibuprofen and Tylenol-no better     HPI: Patient is 21 y.o. female who presents today for left heel pain for 6-8 months.  She relates her mom has had plantar fasciitis and she has tried stretching and massage on her heel with no relief.     Physical Exam  GENERAL APPEARANCE: Alert, conversant. Appropriately groomed. No acute distress.  VASCULAR: Pedal pulses palpable at 2/4 DP and PT bilateral.  Capillary refill time is immediate to all digits,  Proximal to distal cooling it warm to warm.  Digital hair growth is present bilateral  NEUROLOGIC: sensation is intact epicritically and protectively to 5.07 monofilament at 5/5 sites bilateral.  Light touch is intact bilateral, vibratory sensation intact bilateral, achilles tendon reflex is intact bilateral.  MUSCULOSKELETAL: pain on palpation plantar medial aspect of the left foot at the medial calcaneal tubercle at insertion of plantar fascia is noted.  Some discomfort with lateral palpation noted as well.  Thin, moderately high arched foot type noted.   DERMATOLOGIC: skin color, texture, and turger are within normal limits.   Patient Active Problem List   Diagnosis Date Noted  . Preventative health care 01/12/2016  . Rash and nonspecific skin eruption 01/12/2016  . ADHD (attention deficit hyperactivity disorder) 08/11/2015    Assessment   Plantar fasciitis left  Plan Discussed treatment options and at this time a plantar fascial injection was recommended.  The patient agreed and a sterile skin prep was applied.  An injection consisting of kenalog and marcaine mixture was infiltrated at the point of maximal tenderness on the left Heel.  The patient tolerated this well and was given instructions for aftercare. rx for meloxicam written and she will return in 4 weeks with a follow up with  Dr. Milinda Pointer.  Discussed the possibility of orthotics at that visit as well.

## 2016-11-17 NOTE — Patient Instructions (Signed)

## 2016-11-17 NOTE — Progress Notes (Deleted)
   Subjective:    Patient ID: Amber Murillo, female    DOB: Jan 30, 1996, 21 y.o.   MRN: [id number]  HPI    Review of Systems  Musculoskeletal: Positive for gait problem.  All other systems reviewed and are negative.      Objective:   Physical Exam        Assessment & Plan:

## 2016-11-17 NOTE — Progress Notes (Signed)
   Subjective:    Patient ID: Amber Murillo, female    DOB: 26-Jan-1996, 21 y.o.   MRN: [id number]  HPI    Review of Systems  Musculoskeletal: Positive for gait problem.  All other systems reviewed and are negative.      Objective:   Physical Exam        Assessment & Plan:

## 2016-11-24 ENCOUNTER — Ambulatory Visit: Payer: BLUE CROSS/BLUE SHIELD | Admitting: Podiatry

## 2016-12-06 ENCOUNTER — Other Ambulatory Visit: Payer: Self-pay | Admitting: Primary Care

## 2016-12-06 DIAGNOSIS — R1011 Right upper quadrant pain: Secondary | ICD-10-CM

## 2016-12-06 NOTE — Telephone Encounter (Signed)
Received refill request electronically Last office visit and refill 08/30/16 #30 See office note

## 2016-12-06 NOTE — Telephone Encounter (Signed)
Has she been taking the omeprazole? Looks like we gave it to her back in January 2018 for a 30 day supply. Is she still experiencing the stomach pain? Did the omeprazole help?

## 2016-12-07 NOTE — Telephone Encounter (Signed)
Spoken and notified patient of Kate's comments. Patient verbalized understanding. 

## 2016-12-07 NOTE — Telephone Encounter (Signed)
I don't want her on those medications long term as they can cause problems in the future. Have her try ranitidine 150 mg tablets twice daily as needed for stomach pain. These may be purchased over the counter. Have her update me if no improvement.

## 2016-12-07 NOTE — Telephone Encounter (Signed)
Patient stated that she has not been taking the omeprazole every day, she takes it as needed. The stomach pain comes and goes. The omeprazole  did help. Patient stated that her mother takes Nexium, should she try the medication. But she is concern since omeprazole works for her.

## 2016-12-13 ENCOUNTER — Ambulatory Visit (INDEPENDENT_AMBULATORY_CARE_PROVIDER_SITE_OTHER): Payer: BLUE CROSS/BLUE SHIELD | Admitting: Podiatry

## 2016-12-13 ENCOUNTER — Encounter: Payer: Self-pay | Admitting: Podiatry

## 2016-12-13 DIAGNOSIS — M722 Plantar fascial fibromatosis: Secondary | ICD-10-CM

## 2016-12-13 NOTE — Progress Notes (Signed)
She presents today for follow-up of her plantar fasciitis to her left foot. She states that she is 100% improved. States that she has pain occasionally in the morning maybe once a week and by the end of the day is sometimes sore.  Objective: Vital signs are stable she's alert and oriented 3 no reproducible pain on palpation of the left heel today.  Assessment: 1. Plantar fasciitis left.  Plan: I reiterated to her today should she redeveloped symptoms or this began to regress she is to notify S immediately. I did recommend that she continue her anti-inflammatory for at least one more month. Should this recur orthotics will be necessary.

## 2016-12-22 ENCOUNTER — Encounter: Payer: Self-pay | Admitting: Primary Care

## 2017-01-02 ENCOUNTER — Encounter: Payer: Self-pay | Admitting: Primary Care

## 2017-01-02 ENCOUNTER — Ambulatory Visit (INDEPENDENT_AMBULATORY_CARE_PROVIDER_SITE_OTHER): Payer: BLUE CROSS/BLUE SHIELD | Admitting: Primary Care

## 2017-01-02 VITALS — BP 124/84 | HR 78 | Temp 98.1°F | Ht 61.0 in | Wt 133.8 lb

## 2017-01-02 DIAGNOSIS — R21 Rash and other nonspecific skin eruption: Secondary | ICD-10-CM | POA: Diagnosis not present

## 2017-01-02 DIAGNOSIS — B9689 Other specified bacterial agents as the cause of diseases classified elsewhere: Secondary | ICD-10-CM

## 2017-01-02 DIAGNOSIS — J019 Acute sinusitis, unspecified: Secondary | ICD-10-CM

## 2017-01-02 MED ORDER — AMOXICILLIN-POT CLAVULANATE 875-125 MG PO TABS
1.0000 | ORAL_TABLET | Freq: Two times a day (BID) | ORAL | 0 refills | Status: DC
Start: 2017-01-02 — End: 2017-01-20
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
## 2017-01-02 NOTE — Patient Instructions (Signed)
Start Augmentin antibiotics. Take 1 tablet by mouth twice daily for 10 days. Continue Zyrtec and Flonase nasal spray.  Ensure you are staying hydrated.  Start Lotrimin cream for the rash. Apply this twice daily. This may be purchased over the counter. Please message me Friday this week if no improvement in the rash.  It was a pleasure to see you today!

## 2017-01-02 NOTE — Assessment & Plan Note (Signed)
Appears fungal, located to anterior chest and face. Will have her start with OTC Lotrimin cream BID. She will message Friday this week if no improvement.

## 2017-01-02 NOTE — Progress Notes (Signed)
Pre visit review using our clinic review tool, if applicable. No additional management support is needed unless otherwise documented below in the visit note. 

## 2017-01-03 ENCOUNTER — Encounter: Payer: Self-pay | Admitting: Primary Care

## 2017-01-04 ENCOUNTER — Encounter: Payer: Self-pay | Admitting: Primary Care

## 2017-01-04 DIAGNOSIS — R21 Rash and other nonspecific skin eruption: Secondary | ICD-10-CM

## 2017-01-04 MED ORDER — PREDNISONE 10 MG PO TABS: ORAL_TABLET | ORAL | 0 refills | Status: DC

## 2017-01-05 ENCOUNTER — Encounter: Payer: Self-pay | Admitting: Primary Care

## 2017-01-15 ENCOUNTER — Encounter: Payer: Self-pay | Admitting: Primary Care

## 2017-01-16 DIAGNOSIS — S60222A Contusion of left hand, initial encounter: Secondary | ICD-10-CM | POA: Diagnosis not present

## 2017-01-17 ENCOUNTER — Encounter: Payer: Self-pay | Admitting: Family Medicine

## 2017-01-17 ENCOUNTER — Ambulatory Visit (INDEPENDENT_AMBULATORY_CARE_PROVIDER_SITE_OTHER): Payer: BLUE CROSS/BLUE SHIELD | Admitting: Family Medicine

## 2017-01-17 ENCOUNTER — Telehealth: Payer: Self-pay | Admitting: Primary Care

## 2017-01-17 ENCOUNTER — Encounter: Payer: Self-pay | Admitting: Primary Care

## 2017-01-17 VITALS — BP 107/69 | HR 77 | Temp 98.3°F | Resp 17 | Ht 63.0 in | Wt 134.0 lb

## 2017-01-17 DIAGNOSIS — S060X0A Concussion without loss of consciousness, initial encounter: Secondary | ICD-10-CM | POA: Diagnosis not present
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
## 2017-01-17 NOTE — Telephone Encounter (Signed)
Noted, reviewed notes that were available from [facility]. Please call Friday this week and check on patient.
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
## 2017-01-20 ENCOUNTER — Ambulatory Visit (INDEPENDENT_AMBULATORY_CARE_PROVIDER_SITE_OTHER): Payer: BLUE CROSS/BLUE SHIELD | Admitting: Physician Assistant

## 2017-01-20 ENCOUNTER — Encounter: Payer: Self-pay | Admitting: Physician Assistant

## 2017-01-20 VITALS — BP 109/72 | HR 72 | Temp 98.0°F | Resp 16 | Ht 62.5 in | Wt 134.0 lb

## 2017-01-20 DIAGNOSIS — S060X0A Concussion without loss of consciousness, initial encounter: Secondary | ICD-10-CM | POA: Diagnosis not present
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
## 2017-01-20 NOTE — Patient Instructions (Addendum)
  Continue to take tylenol as needed, using ear muffling devices, and keeping away from cognitive stimuli.  You are doing great, and I am happy to see this.   Hydrate well and eat clean (fresh fruit vegetables, lean meats), as you are doing.    IF you received an x-ray today, you will receive an invoice from [facility]. Please contact [facility] at [phone] with questions or concerns regarding your invoice.   IF you received labwork today, you will receive an invoice from [facility]. Please contact [facility] at [phone] with questions or concerns regarding your invoice.   Our billing staff will not be able to assist you with questions regarding bills from these companies.  You will be contacted with the lab results as soon as they are available. The fastest way to get your results is to activate your My Chart account. Instructions are located on the last page of this paperwork. If you have not heard from [place] regarding the results in 2 weeks, please contact this office.

## 2017-01-20 NOTE — Telephone Encounter (Signed)
Message left for patient to return my call.  

## 2017-01-24 ENCOUNTER — Encounter: Payer: Self-pay | Admitting: Physician Assistant

## 2017-01-24 ENCOUNTER — Ambulatory Visit (INDEPENDENT_AMBULATORY_CARE_PROVIDER_SITE_OTHER): Payer: BLUE CROSS/BLUE SHIELD | Admitting: Physician Assistant

## 2017-01-24 VITALS — BP 100/64 | HR 74 | Temp 98.2°F | Resp 16 | Ht 62.5 in | Wt 134.2 lb

## 2017-01-24 DIAGNOSIS — S060X0D Concussion without loss of consciousness, subsequent encounter: Secondary | ICD-10-CM | POA: Diagnosis not present

## 2017-01-24 NOTE — Progress Notes (Signed)
PRIMARY CARE AT [facility] [address], [place] [address] [phone]  Date:  01/24/2017   Name:  Amber Murillo   DOB:  [birth year]   MRN:  [id number]  PCP:  [name], NP    History of Present Illness:  Amber Murillo is a 21 y.o. female patient who presents to PCP with  Chief Complaint  Patient presents with  . Follow-up    Concussion     Follow up of concussion symptoms full week after event.  She reports that she continues to have improvement.  May have a headache once per day, but easily resolved with tylenol.  She is hydrating well and eating well.  No fatigue, irritability, or disorientation.  She has returned to work at [employer], and notes no change or affect from the work.  Continues to use ear plugs with work.  Patient Active Problem List   Diagnosis Date Noted  . Preventative health care 01/12/2016  . Rash and nonspecific skin eruption 01/12/2016  . ADHD (attention deficit hyperactivity disorder) 08/11/2015    Past Medical History:  Diagnosis Date  . ADHD (attention deficit hyperactivity disorder)     No past surgical history on file.  Social History  Substance Use Topics  . Smoking status: Never Smoker  . Smokeless tobacco: Never Used  . Alcohol use No    No family history on file.  No Known Allergies  Medication list has been reviewed and updated.  Current Outpatient Prescriptions on File Prior to Visit  Medication Sig Dispense Refill  . amphetamine-dextroamphetamine (ADDERALL XR) 10 MG 24 hr capsule Take 1 capsule (10 mg total) by mouth daily. 30 capsule 0  . cetirizine (ZYRTEC) 10 MG tablet Take 10 mg by mouth daily as needed for allergies.    Lenda Kelp FE 1/20 1-20 MG-MCG tablet     . Multiple Vitamins-Calcium (ONE-A-DAY WOMENS FORMULA PO) Take 1 tablet by mouth daily.    . ranitidine (ZANTAC) 150 MG tablet Take 150 mg by mouth 2 (two) times daily.    . meloxicam (MOBIC) 15 MG tablet Take 1 tablet (15 mg total) by mouth daily.  (Patient not taking: Reported on 01/20/2017) 30 tablet 3   No current facility-administered medications on file prior to visit.     ROS ROS otherwise unremarkable unless listed above.  Physical Examination: BP 100/64   Pulse 74   Temp 98.2 F (36.8 C) (Oral)   Resp 16   Ht 5' 2.5" (1.588 m)   Wt 134 lb 3.2 oz (60.9 kg)   SpO2 99%   BMI 24.15 kg/m  Ideal Body Weight: Weight in (lb) to have BMI = 25: 138.6  Physical Exam  Constitutional: She is oriented to person, place, and time. She appears well-developed and well-nourished. No distress.  HENT:  Head: Normocephalic and atraumatic.  Right Ear: External ear normal.  Left Ear: External ear normal.  Eyes: Conjunctivae and EOM are normal. Pupils are equal, round, and reactive to light.  Cardiovascular: Normal rate.   Pulmonary/Chest: Effort normal. No respiratory distress.  Neurological: She is alert and oriented to person, place, and time. No cranial nerve deficit or sensory deficit. She displays a negative Romberg sign. Coordination and gait normal.  Normal f to n.  Skin: She is not diaphoretic.  Psychiatric: She has a normal mood and affect. Her behavior is normal.     Assessment and Plan: Amber Murillo is a 21 y.o. female who is here today for follow  up of concussion Much improvement.  Advised continuing treatment plan.  rtc as needed.  Letter of release given to patient. Concussion without loss of consciousness, subsequent encounter  Ivar Drape, PA-C [facility] 5/29/201811:51 AM

## 2017-01-24 NOTE — Patient Instructions (Signed)
     IF you received an x-ray today, you will receive an invoice from [facility]. Please contact [facility] at [phone] with questions or concerns regarding your invoice.   IF you received labwork today, you will receive an invoice from [facility]. Please contact [facility] at [phone] with questions or concerns regarding your invoice.   Our billing staff will not be able to assist you with questions regarding bills from these companies.  You will be contacted with the lab results as soon as they are available. The fastest way to get your results is to activate your My Chart account. Instructions are located on the last page of this paperwork. If you have not heard from us regarding the results in 2 weeks, please contact this office.     

## 2017-01-25 NOTE — Telephone Encounter (Signed)
Patient reply back through MyChart and stated that she is doing better.

## 2017-01-25 NOTE — Telephone Encounter (Signed)
Message left for patient to return my call.  

## 2017-01-25 NOTE — Telephone Encounter (Signed)
Send patient a message through MyChart. 

## 2017-01-25 NOTE — Telephone Encounter (Signed)
Noted  

## 2017-01-26 DIAGNOSIS — S60222D Contusion of left hand, subsequent encounter: Secondary | ICD-10-CM | POA: Diagnosis not present

## 2017-02-09 ENCOUNTER — Encounter: Payer: Self-pay | Admitting: Primary Care

## 2017-02-13 DIAGNOSIS — Z6825 Body mass index (BMI) 25.0-25.9, adult: Secondary | ICD-10-CM | POA: Diagnosis not present

## 2017-02-13 DIAGNOSIS — Z01419 Encounter for gynecological examination (general) (routine) without abnormal findings: Secondary | ICD-10-CM | POA: Diagnosis not present

## 2017-03-10 DIAGNOSIS — Z30430 Encounter for insertion of intrauterine contraceptive device: Secondary | ICD-10-CM | POA: Diagnosis not present

## 2017-05-19 ENCOUNTER — Other Ambulatory Visit: Payer: Self-pay | Admitting: *Deleted

## 2017-05-19 DIAGNOSIS — F909 Attention-deficit hyperactivity disorder, unspecified type: Secondary | ICD-10-CM

## 2017-05-19 NOTE — Telephone Encounter (Signed)
Last Rx 08/2016. Last OV 12/2016-acute

## 2017-05-24 NOTE — Telephone Encounter (Signed)
Pt called back to ck on Adderall rx. I spoke with pt and she said last semester she asked Gentry Fitz NP if she did not need the Adderall could she go without it and pt was advised yes; pt did fine last semester without Adderall but this semester pt has heavier load and is noticing problems focusing. Pt request rx Adderall. Call when ready for pick up. Last printed # 30 on 08/30/16. Pt last seen ADHD 08/30/16. 01/02/17 acute visit.Please advise.

## 2017-05-26 MED ORDER — AMPHETAMINE-DEXTROAMPHET ER 10 MG PO CP24: 10.0000 mg | ORAL_CAPSULE | Freq: Every day | ORAL | 0 refills | Status: DC

## 2017-05-26 NOTE — Telephone Encounter (Signed)
Rx ready for pick up at her convenience.

## 2017-05-29 NOTE — Telephone Encounter (Signed)
Spoken and notified patient that Rx is left in the front office.

## 2017-06-06 DIAGNOSIS — Z30431 Encounter for routine checking of intrauterine contraceptive device: Secondary | ICD-10-CM | POA: Diagnosis not present

## 2017-08-10 DIAGNOSIS — M542 Cervicalgia: Secondary | ICD-10-CM | POA: Diagnosis not present

## 2017-08-10 DIAGNOSIS — S335XXA Sprain of ligaments of lumbar spine, initial encounter: Secondary | ICD-10-CM | POA: Diagnosis not present

## 2017-09-13 DIAGNOSIS — R152 Fecal urgency: Secondary | ICD-10-CM | POA: Diagnosis not present

## 2017-09-13 DIAGNOSIS — R197 Diarrhea, unspecified: Secondary | ICD-10-CM | POA: Diagnosis not present

## 2017-09-15 ENCOUNTER — Other Ambulatory Visit: Payer: Self-pay | Admitting: Primary Care

## 2017-09-15 DIAGNOSIS — F909 Attention-deficit hyperactivity disorder, unspecified type: Secondary | ICD-10-CM

## 2017-09-15 NOTE — Telephone Encounter (Signed)
Ok to refill? Electronically refill request for amphetamine-dextroamphetamine (ADDERALL XR) 10 MG 24 hr capsule  Last prescribed on 05/26/2017. Last seen on 01/02/2017

## 2017-09-15 NOTE — Telephone Encounter (Signed)
For some reason I cannot pull up patient in PMP aware. Will you please call the pharmacy to confirm that she had her prescriptions filled and provide filling dates?

## 2017-09-18 NOTE — Telephone Encounter (Signed)
Still couldn't find patient in PMP Aware, called [facility] who stated that she's under the name Amber Murillo. Please correct this in our system. Please also ask her how often she's taking this medication. It's not one of those medications that are to be taken "as needed". Okay to skip the weekends, but typically used for several days consecutively.

## 2017-09-18 NOTE — Telephone Encounter (Signed)
I have called [facility]. I was told that you can find patient in the PMP by Charlynn Grimes not under Ebony Hail.  Patient had it last filled on 06/01/2017, 08/30/2016, and 07/15/2016. Bea looked up patient for me, she stated that patient only gets it at [facility] and patient once told her that she does not take the medication daily.

## 2017-09-20 ENCOUNTER — Telehealth: Payer: Self-pay | Admitting: Primary Care

## 2017-09-20 NOTE — Telephone Encounter (Signed)
Spoken to patient. She stated that her legal name is what we have in the our system. Amber Murillo at [facility] stated that she is not sure who set up patient on the PMP on Amber Murillo.  Patient stated that she usually skip the weekends then just forget all together. Patient stated she is trying not to forget and take the medication everyday now. She also stated that she has about 15 tablets and is going to finish them. She ask to placed this refills on hold until she finish what she has.

## 2017-09-20 NOTE — Telephone Encounter (Signed)
Copied from [email or web address]. Topic: Quick Communication - See Telephone Encounter >> Sep 20, 2017 11:32 AM Cleaster Corin, NT wrote: CRM for notification. See Telephone encounter for:   09/20/17. Pt. Calling to see if her rx. For adderall is up front to pick up called office 3x no answer goes to vm pt. Can be reached at [phone]

## 2017-09-20 NOTE — Telephone Encounter (Signed)
Noted. This is being addressed in another refill encounter that is dated on 09/15/2017

## 2017-09-20 NOTE — Telephone Encounter (Signed)
Spoke with Bea at [facility] and notified her that Amber Murillo is her legal name, Jari Pigg will update this in PMP aware. Also spoke with patient about intermittent use of the Adderall. Discussed that it's not advised to take "as needed" and explained that she needs to decide to take or not. She doesn't think she notices any improvement so we discussed to hold medication for 2 weeks and update. If she feels as though the medication was helpful then we'll resume.

## 2017-10-06 ENCOUNTER — Encounter: Payer: Self-pay | Admitting: Primary Care

## 2017-10-19 ENCOUNTER — Encounter: Payer: Self-pay | Admitting: Primary Care

## 2017-10-19 DIAGNOSIS — F909 Attention-deficit hyperactivity disorder, unspecified type: Secondary | ICD-10-CM

## 2017-10-20 MED ORDER — AMPHETAMINE-DEXTROAMPHET ER 10 MG PO CP24: 10.0000 mg | ORAL_CAPSULE | Freq: Every day | ORAL | 0 refills | Status: DC

## 2017-10-25 DIAGNOSIS — N760 Acute vaginitis: Secondary | ICD-10-CM | POA: Diagnosis not present

## 2017-10-26 ENCOUNTER — Encounter: Payer: Self-pay | Admitting: Primary Care

## 2017-10-31 ENCOUNTER — Ambulatory Visit: Payer: BLUE CROSS/BLUE SHIELD | Admitting: Primary Care

## 2017-11-02 ENCOUNTER — Ambulatory Visit: Payer: BLUE CROSS/BLUE SHIELD | Admitting: Primary Care

## 2017-11-09 ENCOUNTER — Encounter: Payer: Self-pay | Admitting: Primary Care

## 2017-11-20 ENCOUNTER — Encounter: Payer: Self-pay | Admitting: Primary Care

## 2017-11-20 DIAGNOSIS — F909 Attention-deficit hyperactivity disorder, unspecified type: Secondary | ICD-10-CM

## 2017-11-20 MED ORDER — AMPHETAMINE-DEXTROAMPHET ER 10 MG PO CP24: 10.0000 mg | ORAL_CAPSULE | Freq: Every day | ORAL | 0 refills | Status: DC

## 2017-11-28 IMAGING — US US ABDOMEN LIMITED
1 series · 14 of 25 positions shown · non-contrast
Comparison: CT of the chest on 08/16/2016

CLINICAL DATA: Right upper quadrant pain.

EXAM:
US ABDOMEN LIMITED - RIGHT UPPER QUADRANT

[Series 1: us abdomen limited · 0.13mm/px · 14 of 64 slices shown]
[im 1/64]
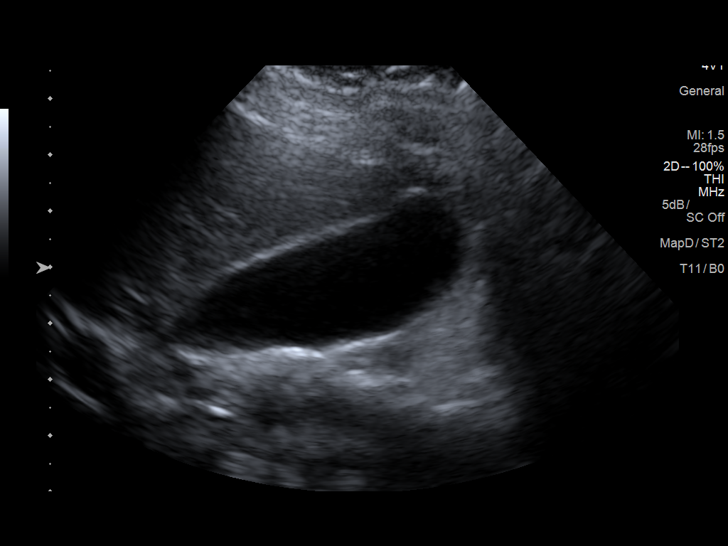
[im 6/64]
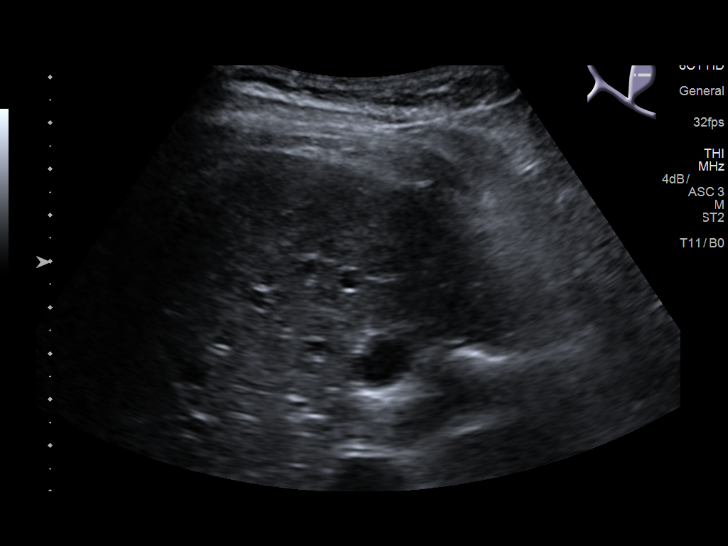
[im 11/64]
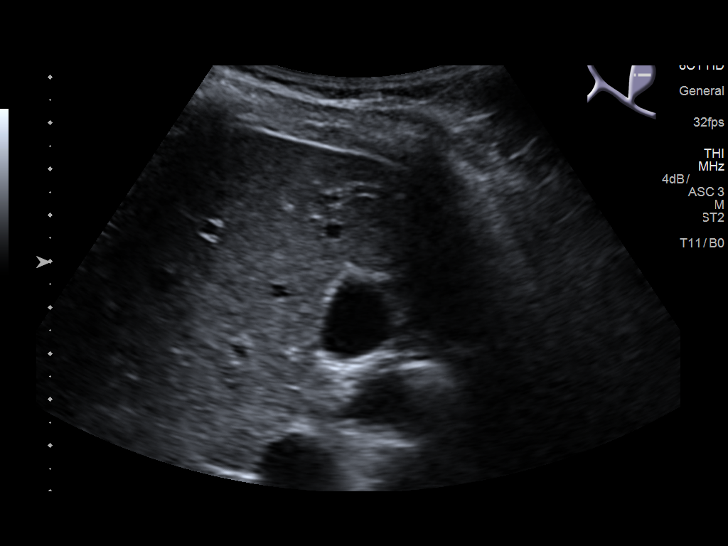
[im 16/64]
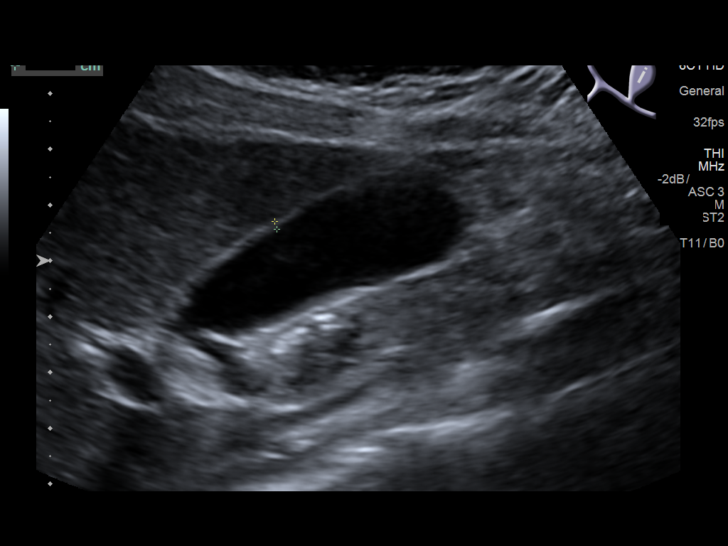
[im 22/64]
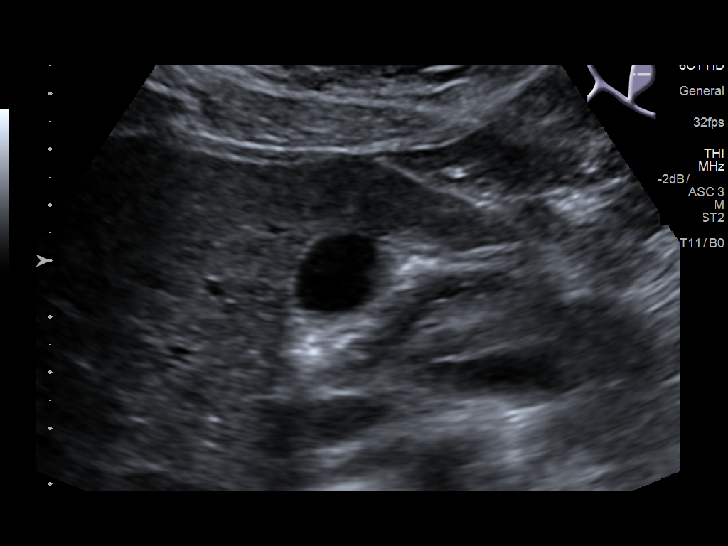
[im 24/64]
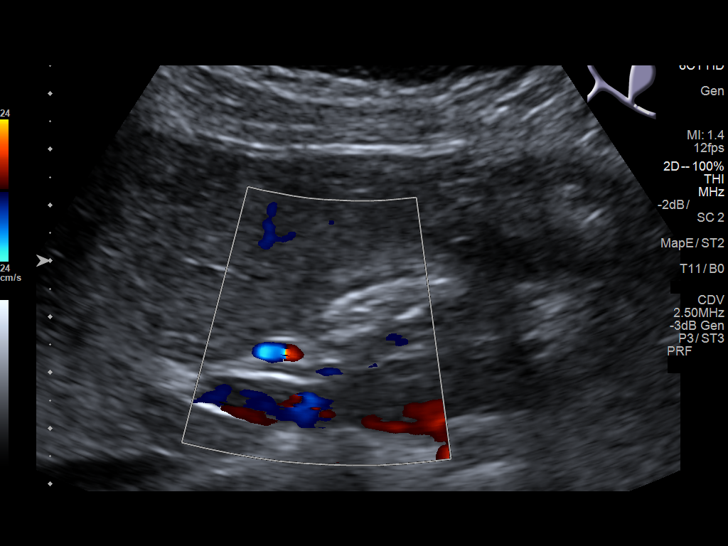
[im 29/64]
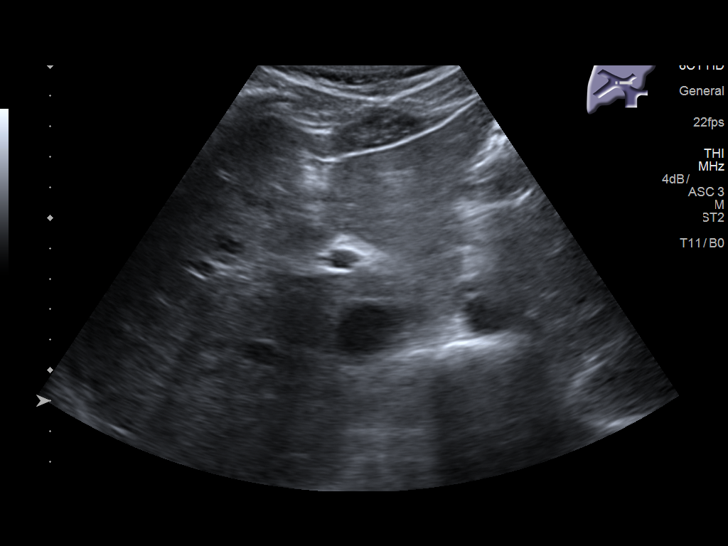
[im 35/64]
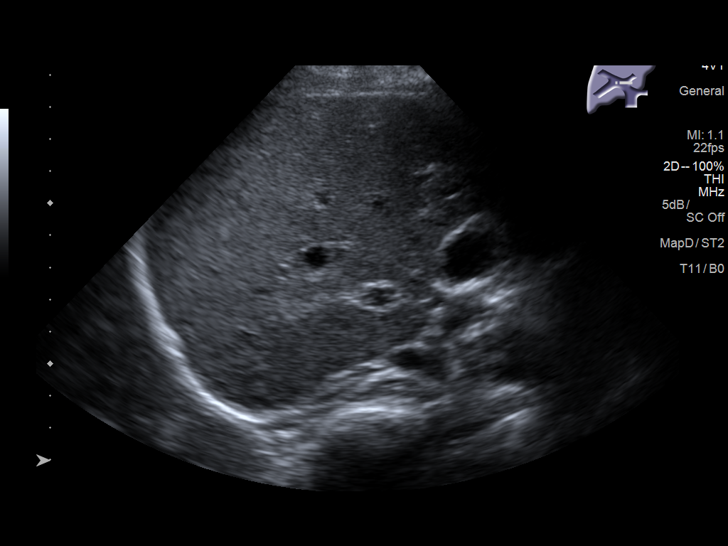
[im 40/64]
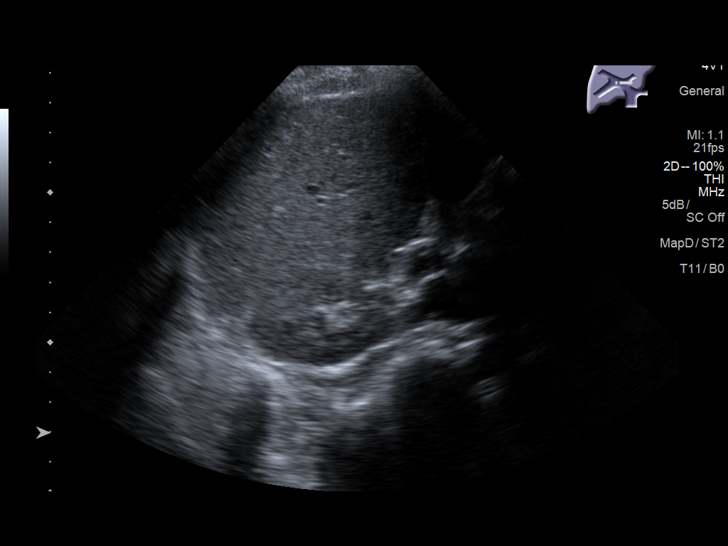
[im 43/64]
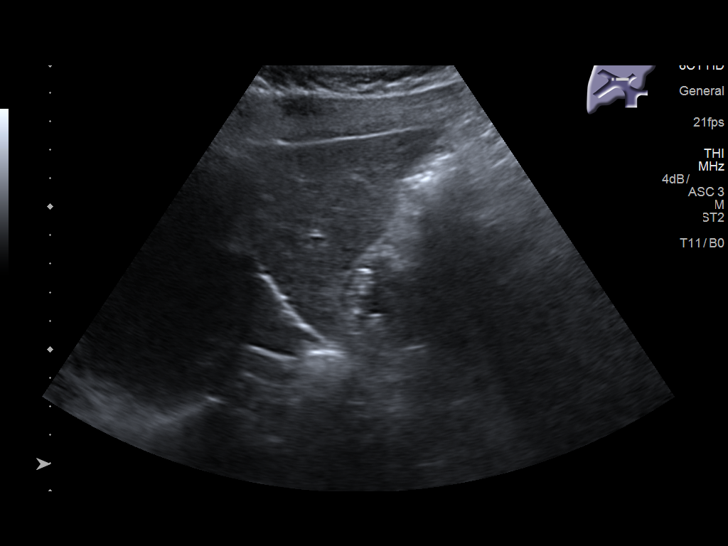
[im 48/64]
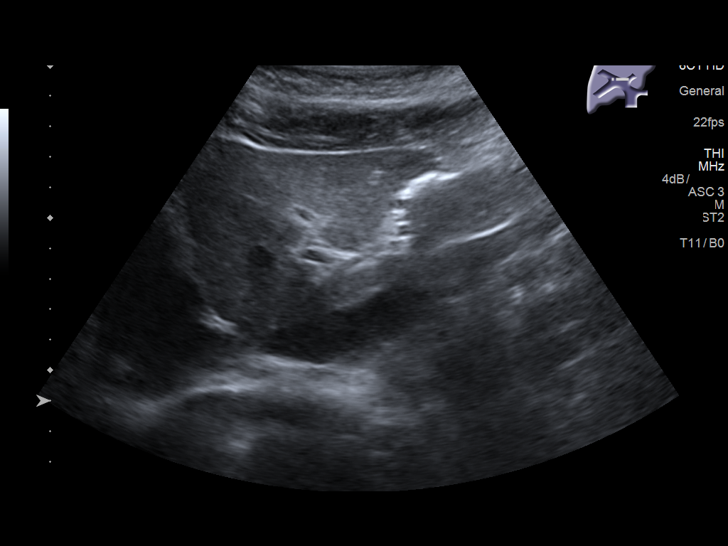
[im 53/64]
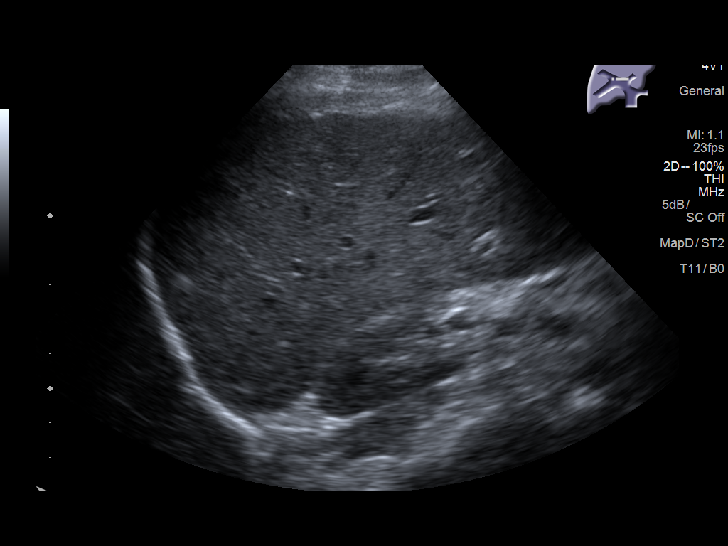
[im 58/64]
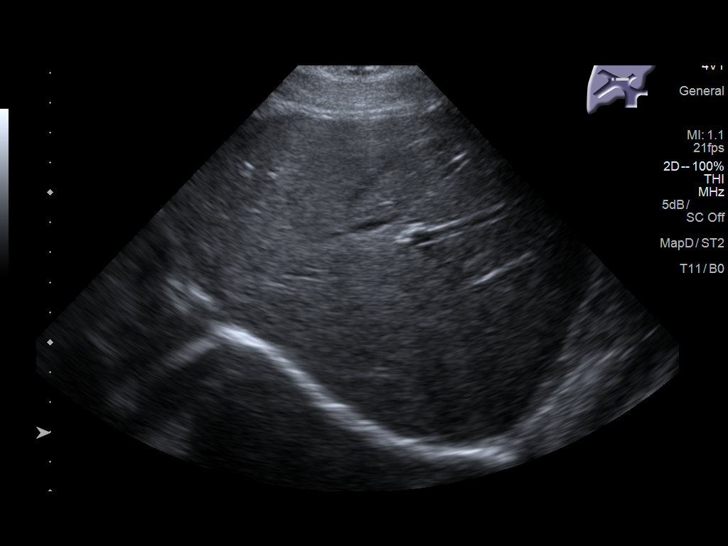
[im 64/64]
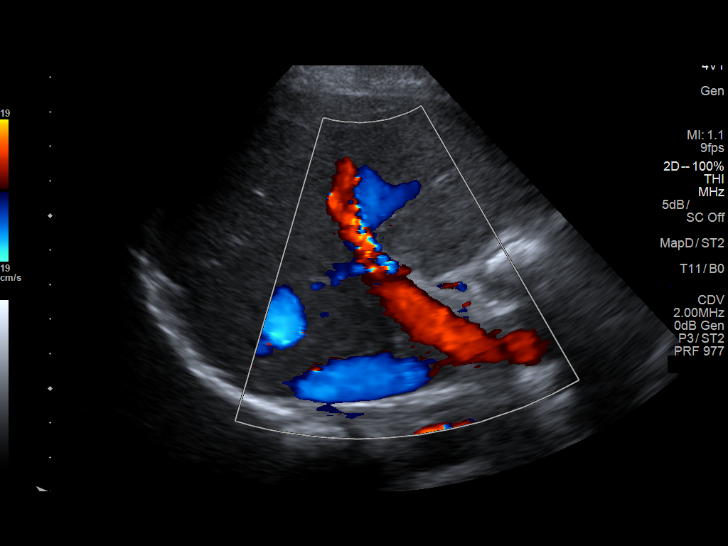

[14 of 25 positions shown; findings below may reference images not displayed]

FINDINGS: Gallbladder:

Insert gallbladder 1.5 mm

Common bile duct:

Diameter: 2.0 mm

Liver:

No focal lesion identified. Within normal limits in parenchymal
echogenicity.
IMPRESSION: Normal exam.

## 2017-11-29 ENCOUNTER — Other Ambulatory Visit: Payer: Self-pay | Admitting: Primary Care

## 2017-11-29 DIAGNOSIS — F909 Attention-deficit hyperactivity disorder, unspecified type: Secondary | ICD-10-CM

## 2017-11-29 NOTE — Telephone Encounter (Signed)
Copied from [place] [id number]. Topic: Quick Communication - Rx Refill/Question >> Nov 29, 2017 12:54 PM Synthia Innocent wrote: Medication: amphetamine-dextroamphetamine (ADDERALL XR) 10 MG 24 hr capsule  Has the patient contacted their pharmacy? Yes.   (Agent: If no, request that the patient contact the pharmacy for the refill.) Preferred Pharmacy (with phone number or street name): Was sent to [facility], [facility] [address]  Agent: Please be advised that RX refills may take up to 3 business days. We ask that you follow-up with your pharmacy.

## 2017-11-29 NOTE — Telephone Encounter (Signed)
Adderall refill request  LOV 01/24/17   Pt of Amber Murillo  [facility] [address]

## 2017-11-29 NOTE — Telephone Encounter (Signed)
adderall xr 10 mg was sent electronically to [facility] on 11/20/17. [facility] [address] said pt has not picked up rx but it is ready for pick up. Unable to reach pt by phone but per DPR left detailed v/m for pt to pick up rx at [place].

## 2017-12-06 ENCOUNTER — Encounter: Payer: Self-pay | Admitting: Physician Assistant

## 2017-12-22 ENCOUNTER — Encounter: Payer: Self-pay | Admitting: Primary Care

## 2017-12-23 DIAGNOSIS — R0981 Nasal congestion: Secondary | ICD-10-CM | POA: Diagnosis not present

## 2017-12-23 DIAGNOSIS — Z6827 Body mass index (BMI) 27.0-27.9, adult: Secondary | ICD-10-CM | POA: Diagnosis not present

## 2017-12-23 DIAGNOSIS — J00 Acute nasopharyngitis [common cold]: Secondary | ICD-10-CM | POA: Diagnosis not present

## 2018-01-01 ENCOUNTER — Encounter: Payer: Self-pay | Admitting: Primary Care

## 2018-01-04 ENCOUNTER — Ambulatory Visit: Payer: BLUE CROSS/BLUE SHIELD | Admitting: Primary Care

## 2018-01-10 ENCOUNTER — Encounter: Payer: Self-pay | Admitting: Primary Care

## 2018-01-10 ENCOUNTER — Ambulatory Visit: Payer: BLUE CROSS/BLUE SHIELD | Admitting: Primary Care

## 2018-01-10 VITALS — BP 106/66 | HR 84 | Temp 98.1°F | Ht 62.25 in | Wt 149.8 lb

## 2018-01-10 DIAGNOSIS — F909 Attention-deficit hyperactivity disorder, unspecified type: Secondary | ICD-10-CM | POA: Diagnosis not present

## 2018-01-10 DIAGNOSIS — F411 Generalized anxiety disorder: Secondary | ICD-10-CM | POA: Diagnosis not present

## 2018-01-10 MED ORDER — AMPHETAMINE-DEXTROAMPHET ER 10 MG PO CP24: 10.0000 mg | ORAL_CAPSULE | Freq: Every day | ORAL | 0 refills | Status: DC

## 2018-01-10 NOTE — Assessment & Plan Note (Signed)
Intermittent for some time, more severe over the last several months. GAD 7 score of 11 today. Will have her meet with therapy for concerns, and if no improvement after numerous sessions then will consider other options for treatment including psychiatry.

## 2018-01-10 NOTE — Progress Notes (Signed)
Subjective:    Patient ID: Amber Murillo, female    DOB: 08-31-1995, 22 y.o.   MRN: [id number]  HPI  Amber Murillo is a 22 year old female who presents today for follow up of ADHD.   She is currently managed on Adderall XR 10 mg for which she takes daily. She is taking an online class now, hopes to be through with school in 2 additional semesters.   She has noticed improved focus, ability to pay attention well in class, and ability to complete school work. She has noticed irritability and anxiety that began several months ago. She'll find herself irritable daily and will have to apologize to family and friends.   GAD 7 score of 11 today. She's missed a few days of her Adderall and continues to notice anxiety. She denies feeling depressed and SI/HI. Her last dose of Adderall was one week ago as she ran out.   Review of Systems  Respiratory: Negative for shortness of breath.   Cardiovascular: Negative for chest pain and palpitations.  Neurological: Negative for dizziness and headaches.       Past Medical History:  Diagnosis Date  . ADHD (attention deficit hyperactivity disorder)      Social History   Socioeconomic History  . Marital status: Single    Spouse name: Not on file  . Number of children: Not on file  . Years of education: Not on file  . Highest education level: Not on file  Occupational History  . Not on file  Social Needs  . Financial resource strain: Not on file  . Food insecurity:    Worry: Not on file    Inability: Not on file  . Transportation needs:    Medical: Not on file    Non-medical: Not on file  Tobacco Use  . Smoking status: Never Smoker  . Smokeless tobacco: Never Used  Substance and Sexual Activity  . Alcohol use: No    Alcohol/week: 0.0 oz  . Drug use: No  . Sexual activity: Yes    Birth control/protection: Pill  Lifestyle  . Physical activity:    Days per week: Not on file    Minutes per session: Not on file  . Stress: Not on file    Relationships  . Social connections:    Talks on phone: Not on file    Gets together: Not on file    Attends religious service: Not on file    Active member of club or organization: Not on file    Attends meetings of clubs or organizations: Not on file    Relationship status: Not on file  . Intimate partner violence:    Fear of current or ex partner: Not on file    Emotionally abused: Not on file    Physically abused: Not on file    Forced sexual activity: Not on file  Other Topics Concern  . Not on file  Social History Narrative   Single.   No children.   Works as a Theme park manager.   Is in school at [employer] for PT.   Enjoys playing softball and riding horses.     No past surgical history on file.  No family history on file.  No Known Allergies  Current Outpatient Medications on File Prior to Visit  Medication Sig Dispense Refill  . cetirizine (ZYRTEC) 10 MG tablet Take 10 mg by mouth daily as needed for allergies.    Lenda Kelp FE 1/20 1-20 MG-MCG tablet     .  Multiple Vitamins-Calcium (ONE-A-DAY WOMENS FORMULA PO) Take 1 tablet by mouth daily.    . ranitidine (ZANTAC) 150 MG tablet Take 150 mg by mouth 2 (two) times daily.    . meloxicam (MOBIC) 15 MG tablet Take 1 tablet (15 mg total) by mouth daily. (Patient not taking: Reported on 01/20/2017) 30 tablet 3   No current facility-administered medications on file prior to visit.     BP 106/66   Pulse 84   Temp 98.1 F (36.7 C) (Oral)   Ht 5' 2.25" (1.581 m)   Wt 149 lb 12 oz (67.9 kg)   SpO2 99%   BMI 27.17 kg/m    Objective:   Physical Exam  Constitutional: She appears well-nourished.  Neck: Neck supple.  Cardiovascular: Normal rate and regular rhythm.  Pulmonary/Chest: Effort normal and breath sounds normal.  Psychiatric: She has a normal mood and affect.          Assessment & Plan:

## 2018-01-10 NOTE — Patient Instructions (Addendum)
You will be contacted regarding your referral to therapy.  Please let us know if you have not been contacted within one week.   Please set up a lab only appointment in 2 weeks to complete your urine drug screen.  It was a pleasure to see you today!

## 2018-01-10 NOTE — Assessment & Plan Note (Addendum)
Doing well on current Adderall dose of 10 mg XR, will continue same for now. Do sense some underlying anxiety, will send to therapy for further evaluation.   UDS is overdue, however, she ran out of her medication 2 weeks ago. Discussed that she needs to return for UDS in 2 weeks or we can no longer provider refills, she verbalized understanding.

## 2018-01-12 ENCOUNTER — Encounter: Payer: Self-pay | Admitting: Primary Care

## 2018-01-15 ENCOUNTER — Encounter: Payer: Self-pay | Admitting: Primary Care

## 2018-01-15 ENCOUNTER — Other Ambulatory Visit: Payer: Self-pay | Admitting: Primary Care

## 2018-01-15 DIAGNOSIS — Z79899 Other long term (current) drug therapy: Secondary | ICD-10-CM

## 2018-01-15 DIAGNOSIS — F902 Attention-deficit hyperactivity disorder, combined type: Secondary | ICD-10-CM

## 2018-01-24 ENCOUNTER — Other Ambulatory Visit: Payer: BLUE CROSS/BLUE SHIELD

## 2018-01-24 ENCOUNTER — Ambulatory Visit: Payer: BLUE CROSS/BLUE SHIELD | Admitting: Psychology

## 2018-01-24 DIAGNOSIS — Z79899 Other long term (current) drug therapy: Secondary | ICD-10-CM

## 2018-01-24 DIAGNOSIS — F902 Attention-deficit hyperactivity disorder, combined type: Secondary | ICD-10-CM | POA: Diagnosis not present

## 2018-01-26 LAB — PAIN MGMT, PROFILE 8 W/CONF, U
6 Acetylmorphine: NEGATIVE ng/mL (ref <10)
Alcohol Metabolites: NEGATIVE ng/mL (ref <500)
Amphetamine: 2343 ng/mL — ABNORMAL HIGH (ref <250)
Amphetamines: POSITIVE ng/mL — AB (ref <500)
Benzodiazepines: NEGATIVE ng/mL (ref <100)
Buprenorphine, Urine: NEGATIVE ng/mL (ref <5)
Cocaine Metabolite: NEGATIVE ng/mL (ref <150)
Creatinine: 32.3 mg/dL
MDMA: NEGATIVE ng/mL (ref <500)
Marijuana Metabolite: NEGATIVE ng/mL (ref <20)
Methamphetamine: NEGATIVE ng/mL (ref <250)
Opiates: NEGATIVE ng/mL (ref <100)
Oxidant: NEGATIVE ug/mL (ref <200)
Oxycodone: NEGATIVE ng/mL (ref <100)
pH: 5.58 (ref 4.5–9.0)

## 2018-03-27 DIAGNOSIS — Z6828 Body mass index (BMI) 28.0-28.9, adult: Secondary | ICD-10-CM | POA: Diagnosis not present

## 2018-03-27 DIAGNOSIS — Z01419 Encounter for gynecological examination (general) (routine) without abnormal findings: Secondary | ICD-10-CM | POA: Diagnosis not present

## 2018-03-27 DIAGNOSIS — Z1329 Encounter for screening for other suspected endocrine disorder: Secondary | ICD-10-CM | POA: Diagnosis not present

## 2018-03-27 LAB — TSH: TSH: 1.48 (ref 0.41–5.90)

## 2018-03-28 ENCOUNTER — Encounter: Payer: Self-pay | Admitting: Primary Care

## 2018-04-18 ENCOUNTER — Ambulatory Visit: Payer: BLUE CROSS/BLUE SHIELD | Admitting: Family Medicine

## 2018-04-18 ENCOUNTER — Encounter: Payer: Self-pay | Admitting: Family Medicine

## 2018-04-18 ENCOUNTER — Ambulatory Visit: Payer: Self-pay | Admitting: Family Medicine

## 2018-04-18 VITALS — BP 119/82 | HR 74 | Ht 62.0 in | Wt 148.9 lb

## 2018-04-18 DIAGNOSIS — M1990 Unspecified osteoarthritis, unspecified site: Secondary | ICD-10-CM | POA: Insufficient documentation

## 2018-04-18 DIAGNOSIS — R5383 Other fatigue: Secondary | ICD-10-CM

## 2018-04-18 DIAGNOSIS — F411 Generalized anxiety disorder: Secondary | ICD-10-CM | POA: Diagnosis not present

## 2018-04-18 DIAGNOSIS — E663 Overweight: Secondary | ICD-10-CM

## 2018-04-18 DIAGNOSIS — K900 Celiac disease: Secondary | ICD-10-CM

## 2018-04-18 DIAGNOSIS — K219 Gastro-esophageal reflux disease without esophagitis: Secondary | ICD-10-CM

## 2018-04-18 DIAGNOSIS — F908 Attention-deficit hyperactivity disorder, other type: Secondary | ICD-10-CM | POA: Diagnosis not present

## 2018-04-18 DIAGNOSIS — F909 Attention-deficit hyperactivity disorder, unspecified type: Secondary | ICD-10-CM

## 2018-04-18 DIAGNOSIS — E039 Hypothyroidism, unspecified: Secondary | ICD-10-CM

## 2018-04-18 DIAGNOSIS — J3089 Other allergic rhinitis: Secondary | ICD-10-CM

## 2018-04-18 MED ORDER — AMPHETAMINE-DEXTROAMPHET ER 10 MG PO CP24: 10.0000 mg | ORAL_CAPSULE | Freq: Every day | ORAL | 0 refills | Status: DC

## 2018-04-18 MED ORDER — BUPROPION HCL 75 MG PO TABS: 75.0000 mg | ORAL_TABLET | Freq: Two times a day (BID) | ORAL | 1 refills | Status: DC

## 2018-04-18 NOTE — Progress Notes (Signed)
New patient office visit note:  Impression and Recommendations:    1. Attention deficit hyperactivity disorder (ADHD), other type   2. GAD (generalized anxiety disorder)   3. Hypothyroidism, unspecified type   4. Other fatigue   5. Gastroesophageal reflux disease, esophagitis presence not specified   6. Transient gluten sensitivity   7. Environmental and seasonal allergies   8. Attention deficit hyperactivity disorder (ADHD), unspecified ADHD type   9. Overweight (BMI 25.0-29.9)    Gynecology -Pt will continue to see [facility] for medical management -Discussed requirement of separate gynecology due to IUD  ADHD -Began Wellbutrin and refilled adderall; see med list below -Discussed wellbutrin and other medications in addition to Adderal to help manage ADHD symptoms -Notified pt about requirement for follow up appointments every 3 months and drug contract with adderall prescription -Discussed non-pharmaceutical options to manage symptoms including high intensity cardio exercise, improving diet, maintaining positive sleep habits, meditation or prayer and counseling   Health Management -Encouraged pt to create an appointment for a full blood panel due to lack of recent testing -Requested pt to create appointment to discuss labs and new medications  GERD -Discussed OTC medications available if patient experiences symptoms -Encouraged pt to contact us if repeatedly experiencing symptoms  Thyroid -Ordered blood testing for TSH, T3 and T4 in roughly 6-8 weeks after starting medicines  -Requested pt create a follow up appointment to discuss panel findings   BMI -Explained to patient what BMI refers to, and what it means medically.    Told patient to think about it as a "medical risk stratification measurement" and how increasing BMI is associated with increasing risk/ or worsening state of various diseases such as hypertension, hyperlipidemia, diabetes, premature OA,  depression etc.  -American Heart Association guidelines for healthy diet, basically Mediterranean diet, and exercise guidelines of 30 minutes 5 days per week or more discussed in detail.  -Health counseling performed.  All questions answered.  Pt was in the office today for 45+ minutes, with over 50% time spent in face to face counseling of patients various medical conditions, treatment plans of those medical conditions including medicine management and lifestyle modification, strategies to improve health and well being; and in coordination of care. SEE ABOVE TREATMENT PLAN FOR DETAILS  Gross side effects, risk and benefits, and alternatives of medications discussed with patient.  Patient is aware that all medications have potential side effects and we are unable to predict every side effect or drug-drug interaction that may occur.  Expresses verbal understanding and consents to current therapy plan and treatment regimen.  Return for 6wks bldwrk, then OV 1-2 wks later- assess wellbutrin addition, review FBW.  Please see AVS handed out to patient at the end of our visit for further patient instructions/ counseling done pertaining to today's office visit.    Note: This document was prepared using Dragon voice recognition software and may include unintentional dictation errors.     This document serves as a record of services personally performed by Mellody Dance, MD. It was created on her behalf by Georga Bora, a trained medical scribe. The creation of this record is based on the scribe's personal observations and the provider's statements to them.   I have reviewed the above medical documentation for accuracy and completeness and I concur.  Mellody Dance 04/18/18 8:29 PM    ----------------------------------------------------------------------------------------------------------------------    Subjective:    Chief complaint:   Chief Complaint  Patient presents with  .  Establish  Care     HPI: Amber Murillo is a pleasant 22 y.o. female who presents to [facility] today to review their medical history with me and establish care.   I asked the patient to review their chronic problem list with me to ensure everything was updated and accurate.    All recent office visits with other providers, any medical records that patient brought in etc  - I reviewed today.     We asked pt to get [place] their medical records from [facility] providers/ specialists that they had seen within the past 3-5 years- if they are in private practice and/or do not work for [employer], [facility], [place], [place] or [employer] owned practice.  Told them to call their specialists to clarify this if they are not sure.   Lifestyle -Pt is interested in working out and asked about dietary supplements for lifting weights -She is a Engineer, site, and is currently engaged -Pt has no children and 2 pets -Drinks roughly a gallon of water each day  -PT does crossfit and yoga, performs 30 min of exercise at least 5 days a week but doesn't do cardio frequently  -States she stopped running due to the appearance of hives and itching with running in the past -Believes she has an sensitivity to gluten that causes reduced sweating; has noticed symptom alleviation with running since removing gluten from her diet  Medical History -Previously saw [facility] for PCP care Gynecology -Sees a gynecologist, can't remember their name -Pt currently has an IUD -PT states she hasn't been having any periods since beginning her IUD roughly last July -Stated she took the pill for years prior to beginning IUD   Thyroid -Has gained roughly 20 lbs recently; gynecologist believes she has hypothyroidism -Taking 56m of synthroid since roughly August 1st due to low TSH testing -Has an appointment soon to check thyroid hormones -States she would like to have Dr. ORaliegh Scarlettake over thyroid care -Stated  previously she was sleeping constantly and taking 2-3hr naps -Has also noticed she's been craving carbohydrates constantly since beginning the medication  ADHD  -Has been diagnosed ADHD since childhood -Was prescribed Adderall until high school, when she decided to stop taking the medication -Struggled in college and saw a psychiatrist at [employer] was diagnosed with anxiety as well -Was diagnosed with anxiety as well -States she feels like her current adderall prescription isn't controlling her symptoms and would like to increase her dosage  Weight Gain -Pt has concerns about weight gain; states she has gained "more than 20 lbs in the last year" despite regular exercise and improving her diet  -Wants to lose weight for her wedding next fall  GERD -Only takes medication if reflux is bothering her -States mother takes nexium daily -She was previously prescribed medication and stopped once her symptoms stopped  Surgical History -None  Family History -Patient states she does not have familial history of disease  Social History -No tobacco use history -Does not drink or use drugs     Wt Readings from Last 3 Encounters:  04/18/18 148 lb 14.4 oz (67.5 kg)  01/10/18 149 lb 12 oz (67.9 kg)  01/24/17 134 lb 3.2 oz (60.9 kg)   BP Readings from Last 3 Encounters:  04/18/18 119/82  01/10/18 106/66  01/24/17 100/64   Pulse Readings from Last 3 Encounters:  04/18/18 74  01/10/18 84  01/24/17 74   BMI Readings from Last 3 Encounters:  04/18/18 27.23  kg/m  01/10/18 27.17 kg/m  01/24/17 24.15 kg/m    Patient Care Team    Relationship Specialty Notifications Start End  Mellody Dance, DO PCP - General Family Medicine  04/18/18   Lady Gary, [facility]    04/18/18     Patient Active Problem List   Diagnosis Date Noted  . GERD (gastroesophageal reflux disease) 04/18/2018  . Hypothyroidism 04/18/2018  . Generalized arthritis 04/18/2018  . Environmental and  seasonal allergies 04/18/2018  . Transient gluten sensitivity 04/18/2018  . Other fatigue 04/18/2018  . Overweight (BMI 25.0-29.9) 04/18/2018  . GAD (generalized anxiety disorder) 01/10/2018  . Preventative health care 01/12/2016  . Rash and nonspecific skin eruption 01/12/2016  . ADHD (attention deficit hyperactivity disorder) 08/11/2015     Past Medical History:  Diagnosis Date  . ADHD (attention deficit hyperactivity disorder)   . GERD (gastroesophageal reflux disease)   . Thyroid disease      Past Medical History:  Diagnosis Date  . ADHD (attention deficit hyperactivity disorder)   . GERD (gastroesophageal reflux disease)   . Thyroid disease      No past surgical history on file.   Family History  Problem Relation Age of Onset  . Hypothyroidism Maternal Grandmother   . Hashimoto's thyroiditis Maternal Grandmother      Social History   Substance and Sexual Activity  Drug Use No     Social History   Substance and Sexual Activity  Alcohol Use No  . Alcohol/week: 0.0 standard drinks     Social History   Tobacco Use  Smoking Status Never Smoker  Smokeless Tobacco Never Used     Current Meds  Medication Sig  . Biotin w/ Vitamins C & E (HAIR/SKIN/NAILS PO) Take 1 tablet by mouth daily.  . cetirizine (ZYRTEC) 10 MG tablet Take 10 mg by mouth daily as needed for allergies.  Marland Kitchen levothyroxine (SYNTHROID, LEVOTHROID) 50 MCG tablet Take 50 mcg by mouth daily.  . meloxicam (MOBIC) 15 MG tablet Take 1 tablet (15 mg total) by mouth daily.  . Multiple Vitamins-Calcium (ONE-A-DAY WOMENS FORMULA PO) Take 1 tablet by mouth daily.    Allergies: Patient has no known allergies.   Review of Systems  Constitutional: Negative for chills, diaphoresis, fever, malaise/fatigue and weight loss.       Unexplained weight gain   HENT: Negative for congestion, sore throat and tinnitus.   Eyes: Negative for blurred vision, double vision and photophobia.  Respiratory:  Negative for cough and wheezing.   Cardiovascular: Negative for chest pain and palpitations.  Gastrointestinal: Negative for blood in stool, diarrhea, nausea and vomiting.  Genitourinary: Negative for dysuria, frequency and urgency.  Musculoskeletal: Negative for joint pain and myalgias.  Skin: Negative for itching and rash.  Neurological: Negative for dizziness, focal weakness, weakness and headaches.  Endo/Heme/Allergies: Negative for environmental allergies and polydipsia. Does not bruise/bleed easily.  Psychiatric/Behavioral: Negative for depression and memory loss. The patient is nervous/anxious (chronic). The patient does not have insomnia.      Objective:   Blood pressure 119/82, pulse 74, height 5' 2"  (1.575 m), weight 148 lb 14.4 oz (67.5 kg), SpO2 99 %. Body mass index is 27.23 kg/m. General: Well Developed, well nourished, and in no acute distress.  Neuro: Alert and oriented x3, extra-ocular muscles intact, sensation grossly intact.  HEENT:[place]/AT, PERRLA, neck supple, No carotid bruits Skin: no gross rashes  Cardiac: Regular rate and rhythm Respiratory: Essentially clear to auscultation bilaterally. Not using accessory muscles, speaking in full sentences.  Abdominal: not grossly distended Musculoskeletal: Ambulates w/o diff, FROM * 4 ext.  Vasc: less 2 sec cap RF, warm and pink  Psych:  No HI/SI, judgement and insight good, Euthymic mood. Full Affect.    Recent Results (from the past 2160 hour(s))  TSH     Status: None   Collection Time: 03/27/18 12:00 AM  Result Value Ref Range   TSH 1.48 0.41 - 5.90

## 2018-04-18 NOTE — Patient Instructions (Addendum)
For more information you can go to:  [email or web address].cy  [email or web address].chadd.org/    Also if you would like to be assessed by specialists and ADD\ADHD:  [facility] [phone] [address], [place], [place] [address] Phone: [phone] Fax: [phone]  Hours of Operation Monday to Friday 8:00 AM - 5:00 PM Saturday and Sunday: Closed      For Parents of Children With ADHD  General Tips: 1. Rules should be clear and brief. Your child should know exactly what you expect from him/her. 2. Give your child chores. This will give him/her a sense of responsibility and boost self-esteem. 3. Short lists of tasks are excellent to help a child remember. 4. Routines are extremely important for children with ADHD. Set up regular times for meals, homework, TV, getting up, and going to bed. Follow through on the schedule! 5. Identify what your child is good at doing (like art, math, computer skills) and build on it. 6. Tell your child that your love and support him/her unconditionally. 7. Catch your child being good and give immediate positive feedback.   Common Daily Problems It is very hard to get my child ready for school in the morning.  Create a consistent and predictable schedule for rising and getting ready in the morning.  Set up a routine so that your child can predict the order of events. Put this routine in writing or in pictures on a poster for your child. Schedule example: Alarm goes off => Brush teeth => Wash face => Get dressed => Eat breakfast =>  Take medication => Get on school bus  Reward and praise your child! This will motivate your child to succeed. Even if your child does not succeed in all parts of the "morning routine", use praise to reward your child when he/she is successful. Progress is often made in a series of small steps!  If your child is on medication, try waking your child up 30 to  45 minutes before the usual wake time and give him/her the medication immediately. Then allow your child to "rest" in bed for the next 30 minutes. This rest period will allow the medication to begin working and your child will be better able to participate in the morning routine.   My child is very irritable in the late afternoon/early evening. (Common side effect of stimulant medications)  The late afternoon and evening is often a very stressful time for all children in all families because parents and children have had to "hold it all together" at work and at school.  If your child is on medication, you child may also be experiencing "rebound" - the time when your child's medication is not wearing off during a time of "high demand" (for example, when homework or chores are usually being done).  Create a period of "downtime" when your child can do calm activities like listen to music, take a bath, read, etc.  Alternatively, let your child "blow off extra energy and tension" by doing some physical exercise.  Talk to your child's doctor about giving your child a smaller dose of medication in the late afternoon. This is called a "stepped down" dose and helps a child transition off of medication in the evening.   My child is losing weight or not eating enough. (Common side effects of stimulant medication use)  Encourage breakfast with calorie-dense foods.  Give the morning dose of medication after your child has already eaten breakfast. Afternoon doses  should also be given after lunch.  Provide your child with nutritious after-school and bedtime snacks that are high in protein and in complex carbohydrates. Examples: Nutrition/proten bars, shakes/drinks made with protein powder, liquid meals.  Get eating started with any highly preferred food before giving other foods.  Consider shifting dinner to a time later in the evening when your child's medication has worn off. Alternatively, allow your  child to "graze" in the evening on healthy snacks, as he or she may be hungriest right before bed.  Follow your child's height and weight with careful measurements at your child's doctor's office and talk to your child's doctor.   Homework Tips  Establish a routine and schedule for homework (a specific time and place.) Don't allow your child to wait until the evening to get started.  Limit distractions in the home during homework hours (reducing unnecessary noise, activity, and phone calls, and turning off the tv).  Praise and compliment your child when he/she puts forth good effort and completes tasks. In a supportive, non critical manner, it is appropriate and helpful to assist in pointing out and making some corrections of errors on the homework.  It is not your responsibility to correct all of your child's errors on homework or make him/her complete and turn in a perfect paper.  Remind your child to do homework and offer incentives: "When you finish your homework, you can watch TV or play a game."  If your child struggles with reading, help by reading the material together or reading it to your son/daughter.  Work a certain amount of time and then stop working on homework.  Many parents find it very difficult to help their own child with schoolwork. Find someone who can. Consider hiring a tutor! Often a junior or senior high school student is ideal, depending on the need and age of your child.   Discipline  Be firm. Set rules and keep to them.  Make sure your child understands the rules, so he/she does not feel uninformed.  Use positive reinforcement. Praise and reward your child for good behavior.  Change or rotate rewards frequently to maintain a high interest level.  Punish behavior, not the child. If your child misbehaves, try alternatives like allowing natural consequences, withdrawing yourself from the conflict, or giving your child a choice.   Taking Care of  Yourself  Come to terms with your child's challenges and strengths.  Seek support from family and friends or professional help such as counseling or support groups.  Help other family members recognize and understand ADHD.

## 2018-04-25 ENCOUNTER — Encounter: Payer: Self-pay | Admitting: Family Medicine

## 2018-05-09 DIAGNOSIS — E039 Hypothyroidism, unspecified: Secondary | ICD-10-CM | POA: Diagnosis not present

## 2018-05-23 ENCOUNTER — Ambulatory Visit: Payer: Self-pay | Admitting: Family Medicine

## 2018-05-30 ENCOUNTER — Other Ambulatory Visit: Payer: Self-pay

## 2018-06-01 ENCOUNTER — Other Ambulatory Visit (INDEPENDENT_AMBULATORY_CARE_PROVIDER_SITE_OTHER): Payer: BLUE CROSS/BLUE SHIELD

## 2018-06-01 ENCOUNTER — Other Ambulatory Visit: Payer: Self-pay

## 2018-06-01 DIAGNOSIS — R5383 Other fatigue: Secondary | ICD-10-CM | POA: Diagnosis not present

## 2018-06-01 DIAGNOSIS — E039 Hypothyroidism, unspecified: Secondary | ICD-10-CM | POA: Diagnosis not present

## 2018-06-03 LAB — CBC WITH DIFFERENTIAL/PLATELET
Basophils Absolute: 0.1 10*3/uL (ref 0.0–0.2)
Basos: 1 %
EOS (ABSOLUTE): 0.2 10*3/uL (ref 0.0–0.4)
Eos: 3 %
Hematocrit: 41.7 % (ref 34.0–46.6)
Hemoglobin: 14.3 g/dL (ref 11.1–15.9)
Immature Grans (Abs): 0 10*3/uL (ref 0.0–0.1)
Immature Granulocytes: 0 %
Lymphocytes Absolute: 2.2 10*3/uL (ref 0.7–3.1)
Lymphs: 26 %
MCH: 30 pg (ref 26.6–33.0)
MCHC: 34.3 g/dL (ref 31.5–35.7)
MCV: 88 fL (ref 79–97)
Monocytes Absolute: 0.4 10*3/uL (ref 0.1–0.9)
Monocytes: 5 %
Neutrophils Absolute: 5.5 10*3/uL (ref 1.4–7.0)
Neutrophils: 65 %
Platelets: 360 10*3/uL (ref 150–450)
RBC: 4.76 x10E6/uL (ref 3.77–5.28)
RDW: 12.2 % — ABNORMAL LOW (ref 12.3–15.4)
WBC: 8.4 10*3/uL (ref 3.4–10.8)

## 2018-06-03 LAB — COMPREHENSIVE METABOLIC PANEL
ALT: 15 IU/L (ref 0–32)
AST: 19 IU/L (ref 0–40)
Albumin/Globulin Ratio: 1.9 (ref 1.2–2.2)
Albumin: 4.5 g/dL (ref 3.5–5.5)
Alkaline Phosphatase: 105 IU/L (ref 39–117)
BUN/Creatinine Ratio: 13 (ref 9–23)
BUN: 11 mg/dL (ref 6–20)
Bilirubin Total: 0.4 mg/dL (ref 0.0–1.2)
CO2: 25 mmol/L (ref 20–29)
Calcium: 9.4 mg/dL (ref 8.7–10.2)
Chloride: 102 mmol/L (ref 96–106)
Creatinine, Ser: 0.86 mg/dL (ref 0.57–1.00)
GFR calc Af Amer: 110 mL/min/{1.73_m2} (ref >59)
GFR calc non Af Amer: 96 mL/min/{1.73_m2} (ref >59)
Globulin, Total: 2.4 g/dL (ref 1.5–4.5)
Glucose: 78 mg/dL (ref 65–99)
Potassium: 5.2 mmol/L (ref 3.5–5.2)
Sodium: 142 mmol/L (ref 134–144)
Total Protein: 6.9 g/dL (ref 6.0–8.5)

## 2018-06-03 LAB — VITAMIN B12: Vitamin B-12: 430 pg/mL (ref 232–1245)

## 2018-06-03 LAB — LIPID PANEL
Chol/HDL Ratio: 5.3 ratio — ABNORMAL HIGH (ref 0.0–4.4)
Cholesterol, Total: 179 mg/dL (ref 100–199)
HDL: 34 mg/dL — ABNORMAL LOW (ref >39)
LDL Calculated: 117 mg/dL — ABNORMAL HIGH (ref 0–99)
Triglycerides: 139 mg/dL (ref 0–149)
VLDL Cholesterol Cal: 28 mg/dL (ref 5–40)

## 2018-06-03 LAB — HEMOGLOBIN A1C
Est. average glucose Bld gHb Est-mCnc: 97 mg/dL
Hgb A1c MFr Bld: 5 % (ref 4.8–5.6)

## 2018-06-03 LAB — VITAMIN D 25 HYDROXY (VIT D DEFICIENCY, FRACTURES): Vit D, 25-Hydroxy: 39 ng/mL (ref 30.0–100.0)

## 2018-06-03 LAB — T4, FREE: Free T4: 1.33 ng/dL (ref 0.82–1.77)

## 2018-06-03 LAB — TSH: TSH: 2.37 u[IU]/mL (ref 0.450–4.500)

## 2018-06-03 LAB — T3, FREE: T3, Free: 3.2 pg/mL (ref 2.0–4.4)

## 2018-06-06 ENCOUNTER — Ambulatory Visit: Payer: Self-pay | Admitting: Family Medicine

## 2018-06-13 ENCOUNTER — Ambulatory Visit: Payer: Self-pay | Admitting: Adult Health

## 2018-06-20 ENCOUNTER — Encounter: Payer: Self-pay | Admitting: Family Medicine

## 2018-06-20 ENCOUNTER — Ambulatory Visit: Payer: BLUE CROSS/BLUE SHIELD | Admitting: Family Medicine

## 2018-06-20 VITALS — BP 131/90 | HR 97 | Ht 62.0 in | Wt 148.7 lb

## 2018-06-20 DIAGNOSIS — E039 Hypothyroidism, unspecified: Secondary | ICD-10-CM

## 2018-06-20 DIAGNOSIS — R5383 Other fatigue: Secondary | ICD-10-CM | POA: Diagnosis not present

## 2018-06-20 DIAGNOSIS — F411 Generalized anxiety disorder: Secondary | ICD-10-CM | POA: Diagnosis not present

## 2018-06-20 DIAGNOSIS — L659 Nonscarring hair loss, unspecified: Secondary | ICD-10-CM

## 2018-06-20 DIAGNOSIS — F908 Attention-deficit hyperactivity disorder, other type: Secondary | ICD-10-CM

## 2018-06-20 DIAGNOSIS — R239 Unspecified skin changes: Secondary | ICD-10-CM

## 2018-06-20 DIAGNOSIS — F909 Attention-deficit hyperactivity disorder, unspecified type: Secondary | ICD-10-CM

## 2018-06-20 DIAGNOSIS — L603 Nail dystrophy: Secondary | ICD-10-CM

## 2018-06-20 DIAGNOSIS — R61 Generalized hyperhidrosis: Secondary | ICD-10-CM | POA: Insufficient documentation

## 2018-06-20 MED ORDER — BUPROPION HCL ER (XL) 150 MG PO TB24: 150.0000 mg | ORAL_TABLET | ORAL | 0 refills | Status: DC

## 2018-06-20 MED ORDER — LEVOTHYROXINE SODIUM 50 MCG PO TABS: 50.0000 ug | ORAL_TABLET | Freq: Every day | ORAL | 1 refills | Status: DC

## 2018-06-20 MED ORDER — AMPHETAMINE-DEXTROAMPHET ER 10 MG PO CP24: 10.0000 mg | ORAL_CAPSULE | Freq: Every day | ORAL | 0 refills | Status: DC

## 2018-06-20 NOTE — Patient Instructions (Signed)
Guidelines for a Low Cholesterol, Low Saturated Fat Diet  Fats - Limit total intake of fats and oils. - Avoid butter, stick margarine, shortening, lard, palm and coconut oils. - Limit mayonnaise, salad dressings, gravies and sauces, unless they are homemade with low-fat ingredients. - Limit chocolate. - Choose low-fat and nonfat products, such as low-fat mayonnaise, low-fat or non-hydrogenated peanut butter, low-fat or fat-free salad dressings and nonfat gravy. - Use vegetable oil, such as canola or olive oil. - Look for margarine that does not contain trans fatty acids. - Use nuts in moderate amounts. - Read ingredient labels carefully to determine both amount and type of fat present in foods. Limit saturated and trans fats! - Avoid high-fat processed and convenience foods.  Meats and Meat Alternatives - Choose fish, chicken, [place] and lean meats. - Use dried beans, peas, lentils and tofu. - Limit egg yolks to three to four per week. - If you eat red meat, limit to no more than three servings per week and choose loin or round cuts. - Avoid fatty meats, such as bacon, sausage, franks, luncheon meats and ribs. - Avoid all organ meats, including liver.  Dairy - Choose nonfat or low-fat milk, yogurt and cottage cheese. - Most cheeses are high in fat. Choose cheeses made from non-fat milk, such as mozzarella and ricotta cheese. - Choose light or fat-free cream cheese and sour cream. - Avoid cream and sauces made with cream.  Fruits and Vegetables - Eat a wide variety of fruits and vegetables. - Use lemon juice, vinegar or "mist" olive oil on vegetables. - Avoid adding sauces, fat or oil to vegetables.  Breads, Cereals and Grains - Choose whole-grain breads, cereals, pastas and rice. - Avoid high-fat snack foods, such as granola, cookies, pies, pastries, doughnuts and croissants.  Cooking Tips - Avoid deep fried foods. - Trim visible fat off meats and remove skin from poultry  before cooking. - Bake, broil, boil, poach or roast poultry, fish and lean meats. - Drain and discard fat that drains out of meat as you cook it. - Add little or no fat to foods. - Use vegetable oil sprays to grease pans for cooking or baking. - Steam vegetables. - Use herbs or no-oil marinades to flavor foods. Nine ways to increase your "good" HDL cholesterol  High-density lipoprotein (HDL) is often referred to as the "good" cholesterol. Having high HDL levels helps carry cholesterol from your arteries to your liver, where it can be used or excreted.  Having high levels of HDL also has antioxidant and anti-inflammatory effects, and is linked to a reduced risk of heart disease (1, 2).  Most health experts recommend minimum blood levels of 40 mg/dl in men and 50 mg/dl in women.  While genetics definitely play a role, there are several other factors that affect HDL levels.  Here are nine healthy ways to raise your "good" HDL cholesterol.  1. Consume olive oil  two pieces of salmon on a plate olive oil being poured into a small dish Extra virgin olive oil may be more healthful than processed olive oils. Olive oil is one of the healthiest fats around.  A large analysis of 42 studies with more than 800,000 participants found that olive oil was the only source of monounsaturated fat that seemed to reduce heart disease risk (3).  Research has shown that one of olive oil's heart-healthy effects is an increase in HDL cholesterol. This effect is thought to be caused by antioxidants it contains called polyphenols (  4, 5, 6, 7).  Extra virgin olive oil has more polyphenols than more processed olive oils, although the amount can still vary among different types and brands.  One study gave 200 healthy young men about 2 tablespoons (25 ml) of different olive oils per day for three weeks.  The researchers found that participants' HDL levels increased significantly more after they consumed the olive  oil with the highest polyphenol content (6).  In another study, when 20 older adults consumed about 4 tablespoons (50 ml) of high-polyphenol extra virgin olive oil every day for six weeks, their HDL cholesterol increased by 6.5 mg/dl, on average (7).  In addition to raising HDL levels, olive oil has been found to boost HDL's anti-inflammatory and antioxidant function in studies of older people and individuals with high cholesterol levels ( 7, 8, 9).  Whenever possible, select high-quality, certified extra virgin olive oils, which tend to be highest in polyphenols.  Bottom line: Extra virgin olive oil with a high polyphenol content has been shown to increase HDL levels in healthy people, the elderly and individuals with high cholesterol.  2. Follow a low-carb or ketogenic diet  Low-carb and ketogenic diets provide a number of health benefits, including weight loss and reduced blood sugar levels.  They have also been shown to increase HDL cholesterol in people who tend to have lower levels.  This includes those who are obese, insulin resistant or diabetic (10, 11, 12, 13, 14, 15, 16, 17).  In one study, people with type 2 diabetes were split into two groups.  One followed a diet consuming less than 50 grams of carbs per day. The other followed a high-carb diet.  Although both groups lost weight, the low-carb group's HDL cholesterol increased almost twice as much as the high-carb group's did (14).  In another study, obese people who followed a low-carb diet experienced an increase in HDL cholesterol of 5 mg/dl overall.  Meanwhile, in the same study, the participants who ate a low-fat, high-carb diet showed a decrease in HDL cholesterol (15).  This response may partially be due to the higher levels of fat people typically consume on low-carb diets.  One study in overweight women found that diets high in meat and cheese increased HDL levels by 5-8%, compared to a higher-carb diet  (18).  What's more, in addition to raising HDL cholesterol, very-low-carb diets have been shown to decrease triglycerides and improve several other risk factors for heart disease (13, 14, 16, 17).  Bottom line: Low-carb and ketogenic diets typically increase HDL cholesterol levels in people with diabetes, metabolic syndrome and obesity.  3. Exercise regularly  Being physically active is important for heart health.  Studies have shown that many different types of exercise are effective at raising HDL cholesterol, including strength training, high-intensity exercise and aerobic exercise (19, 20, 21, 22, 23, 24).  However, the biggest increases in HDL are typically seen with high-intensity exercise.  One small study followed women who were living with polycystic ovary syndrome (PCOS), which is linked to a higher risk of insulin resistance. The study required them to perform high-intensity exercise three times a week.  The exercise led to an increase in HDL cholesterol of 8 mg/dL after 10 weeks. The women also showed improvements in other health markers, including decreased insulin resistance and improved arterial function (23).  In a 12-week study, overweight men who performed high-intensity exercise experienced a 10% increase in HDL cholesterol.  In contrast, the low-intensity exercise group showed only a  2% increase and the endurance training group experienced no change (24).  However, even lower-intensity exercise seems to increase HDL's anti-inflammatory and antioxidant capacities, whether or not HDL levels change (20, 21, 25).  Overall, high-intensity exercise such as high-intensity interval training (HIIT) and high-intensity circuit training (HICT) may boost HDL cholesterol levels the most.  Bottom line: Exercising several times per week can help raise HDL cholesterol and enhance its anti-inflammatory and antioxidant effects. High-intensity forms of exercise may be especially  effective.  4. Add coconut oil to your diet  Studies have shown that coconut oil may reduce appetite, increase metabolic rate and help protect brain health, among other benefits.  Some people may be concerned about coconut oil's effects on heart health due to its high saturated fat content.  However, it appears that coconut oil is actually quite heart healthy.  Coconut oil tends to raise HDL cholesterol more than many other types of fat.  In addition, it may improve the ratio of low-density-lipoprotein (LDL) cholesterol, the "bad" cholesterol, to HDL cholesterol. Improving this ratio reduces heart disease risk (26, 27, 28, 29).  One study examined the health effects of coconut oil on 57 women with excess belly fat. The researchers found that participants who took coconut oil daily experienced increased HDL cholesterol and a lower LDL-to-HDL ratio.  In contrast, the group who took soybean oil daily had a decrease in HDL cholesterol and an increase in the LDL-to-HDL ratio (29).  Most studies have found these health benefits occur at a dosage of about 2 tablespoons (30 ml) of coconut oil per day. It's best to incorporate this into cooking rather than eating spoonfuls of coconut oil on their own.  Bottom line: Consuming 2 tablespoons (30 ml) of coconut oil per day may help increase HDL cholesterol levels.  5. Stop smoking  cigarette butt Quitting smoking can reduce the risk of heart disease and lung cancer. Smoking increases the risk of many health problems, including heart disease and lung cancer (30).  One of its negative effects is a suppression of HDL cholesterol.  Some studies have found that quitting smoking can increase HDL levels. Indeed, one study found no significant differences in HDL levels between former smokers and people who had never smoked (31, 32, 33, 34, 35).  In a one-year study of more than 1,500 people, those who quit smoking had twice the increase in HDL as those  who resumed smoking within the year. The number of large HDL particles also increased, which further reduced heart disease risk (32).  One study followed smokers who switched from traditional cigarettes to electronic cigarettes for one year. They found that the switch was associated with an increase in HDL cholesterol of 5 mg/dl, on average (33).  When it comes to the effect of nicotine replacement patches on HDL levels, research results have been mixed.  One study found that nicotine replacement therapy led to higher HDL cholesterol. However, other research suggests that people who use nicotine patches likely won't see increases in HDL levels until after replacement therapy is completed (34, 36).  Even in studies where HDL cholesterol levels didn't increase after people quit smoking, HDL function improved, resulting in less inflammation and other beneficial effects on heart health (37).  Bottom line: Quitting smoking can increase HDL levels, improve HDL function and help protect heart health.  6. Lose weight  When overweight and obese people lose weight, their HDL cholesterol levels usually increase.  What's more, this benefit seems to occur whether weight  loss is achieved by calorie counting, carb restriction, intermittent fasting, weight loss surgery or a combination of diet and exercise (16, 38, 39, 40, 41, 42).  One study examined HDL levels in more than 3,000 overweight and obese [place] adults who followed a lifestyle modification program for one year.  The researchers found that losing at least 6.6 lbs (3 kg) led to an increase in HDL cholesterol of 4 mg/dl, on average (41).  In another study, when obese people with type 2 diabetes consumed calorie-restricted diets that provided 20-30% of calories from protein, they experienced significant increases in HDL cholesterol levels (42).  The key to achieving and maintaining healthy HDL cholesterol levels is choosing the type of diet that  makes it easiest for you to lose weight and keep it off.  Bottom Line: Several methods of weight loss have been shown to increase HDL cholesterol levels in people who are overweight or obese.  7. Choose purple produce  Consuming purple-colored fruits and vegetables is a delicious way to potentially increase HDL cholesterol.  Purple produce contains antioxidants known as anthocyanins.  Studies using anthocyanin extracts have shown that they help fight inflammation, protect your cells from damaging free radicals and may also raise HDL cholesterol levels (43, 44, 45, 46).  In a 24-week study of 35 people with diabetes, those who took an anthocyanin supplement twice a day experienced a 19% increase in HDL cholesterol, on average, along with other improvements in heart health markers (45).  In another study, when people with cholesterol issues took anthocyanin extract for 12 weeks, their HDL cholesterol levels increased by 13.7% (46).  Although these studies used extracts instead of foods, there are several fruits and vegetables that are very high in anthocyanins. These include eggplant, purple corn, red cabbage, blueberries, blackberries and black raspberries.  Bottom line: Consuming fruits and vegetables rich in anthocyanins may help increase HDL cholesterol levels.  8. Eat fatty fish often  The omega-3 fats in fatty fish provide major benefits to heart health, including a reduction in inflammation and better functioning of the cells that line your arteries (47, 48).  There's some research showing that eating fatty fish or taking fish oil may also help raise low levels of HDL cholesterol (49, 50, 51, 52, 53).  In a study of 33 heart disease patients, participants that consumed fatty fish four times per week experienced an increase in HDL cholesterol levels. The particle size of their HDL also increased (52).  In another study, overweight men who consumed herring five days a week for six  weeks had a 5% increase in HDL cholesterol, compared with their levels after eating lean pork and chicken five days a week (53).  However, there are a few studies that found no increase in HDL cholesterol in response to increased fish or omega-3 supplement intake (54, 55).  In addition to herring, other types of fatty fish that may help raise HDL cholesterol include salmon, sardines, mackerel and anchovies.  Bottom line: Eating fatty fish several times per week may help increase HDL cholesterol levels and provide other benefits to heart health.  9. Avoid artificial trans fats  Artificial trans fats have many negative health effects due to their inflammatory properties (56, 57).  There are two types of trans fats. One kind occurs naturally in animal products, including full-fat dairy.  In contrast, the artificial trans fats found in margarines and processed foods are created by adding hydrogen to unsaturated vegetable and seed oils. These fats are also  known as industrial trans fats or partially hydrogenated fats.  Research has shown that, in addition to increasing inflammation and contributing to several health problems, these artificial trans fats may lower HDL cholesterol levels.  In one study, researchers compared how people's HDL levels responded when they consumed different margarines.  The study found that participants' HDL cholesterol levels were 10% lower after consuming margarine containing partially hydrogenated soybean oil, compared to their levels after consuming palm oil (58).  Another controlled study followed 40 adults who had diets high in different types of trans fats.  They found that HDL cholesterol levels in women were significantly lower after they consumed the diet high in industrial trans fats, compared to the diet containing naturally occurring trans fats (59).  To protect heart health and keep HDL cholesterol in the healthy range, it's best to avoid artificial trans  fats altogether.  Bottom line: Artificial trans fats have been shown to lower HDL levels and increase inflammation, compared to other fats.  Take home message  Although your HDL cholesterol levels are partly determined by your genetics, there are many things you can do to naturally increase your own levels.  Fortunately, the practices that raise HDL cholesterol often provide other health benefits as well.

## 2018-06-20 NOTE — Progress Notes (Signed)
Assessment and plan:  1. Attention deficit hyperactivity disorder (ADHD), other type   2. GAD (generalized anxiety disorder)   3. Hypothyroidism, unspecified type   4. Other fatigue   5. Attention deficit hyperactivity disorder (ADHD), unspecified ADHD type   6. Loss of hair   7. Thin nails   8. Excessive sweating     GAD -Increased Wellbutrin dosage; see med list -Explained that medication is part of treatment along with exercise, healthy diet and counseling -Explained that it is important to make positive lifestyle changes to see the maximum effect of the medication -Explained that the medication is to help even out her mood, but will not all of a sudden make her life amazing -Encouraged pt to contact us if she begins having negative SE or if she feels the medication is still not working as well as she needs  hypothyroid -Explained that TSH works at the neural level and T3 and T4 work at the gland level -Explained healthy ranges for TSH, T3 and T4 levels and educated pt that she is in the correct area -Educated pt that her synthroid is keeping her thyroid levels within healthy ranges  Hyperhydrosis/hair loss -referred to dermatology -Explained that this is treated by dermatology -Explained that botox treatment is an option but dermatology will know the best options for treatment -Recommended clinical strength Deoderant over the counter -Explained hair falling out is also in the realm of dermatology and encouraged her to voice her concerns with hair loss in dermatology as well -Explained that pt can put deoderant anywhere she sweats to help with control, including legs, back of knees, and elbows  Vitamin D -started vitamin D; see med list below -Educated pt that goal levels were from 40-60 -Explained that vitamin D is usually higher when  -recommended taking a supplement bc pt levels are low normal in the  summer  Cholesterol -Educated pt about HDL and its positive role in cardiovascular health -explained that triglycerides are largely impacted by fatty carbs and alcohol  -Explained that LDL has the highest indication of negative impact on risk of MI and stroke -Explained what kinds of food negatively impact LDL levels and healthy ranges for LDL -Explained that decreasing saturated and trans fats help improve LDL levels  -Educated pt about the positive impact of exercise on cholesterol especially HDL -Reviewed lipid panel results with pt -Explained that because pt is young and active, she is not currently a candidate for medication -Explained the factors that influence ASCVD risks and levels that recommend medication use  Exercise -Educated pt about AHA guidelines for cardiovascular health and exercise amounts -Explained the level of activity that counts as cardio exercise  Lab Results 06/01/2018 -Explained the RBC, WBC, platelets, hemoglobin and hematocrit results and their indications for blood health -Explained that blood cells are created in the bone marrow, especially the ilium -Explained vitamin B12's role in the body and healthy levels -Educated pt about the A1C and that because her level is in the low normal, she may notice irritability when she skips meals -Explained GFR, Ser creatinine and electrolyte results and their indication of kidney health -Explained ALT and AST levels and their indication of liver health    Education and routine counseling performed. Handouts provided.  Medications Discontinued During This Encounter  Medication Reason  . buPROPion (WELLBUTRIN) 75 MG tablet   . amphetamine-dextroamphetamine (ADDERALL XR) 10 MG 24 hr capsule Reorder  . levothyroxine (SYNTHROID, LEVOTHROID) 50 MCG tablet Reorder  Return for 30- ADD/ ADHD recheck every 58mo will be due around feb 20th will  be due.   Anticipatory guidance and routine counseling done re:  condition, txmnt options and need for follow up. All questions of patient's were answered.   Gross side effects, risk and benefits, and alternatives of medications discussed with patient.  Patient is aware that all medications have potential side effects and we are unable to predict every sideeffect or drug-drug interaction that may occur.  Expresses verbal understanding and consents to current therapy plan and treatment regiment.  Please see AVS handed out to patient at the end of our visit for additional patient instructions/ counseling done pertaining to today's office visit.  Note:  This document was prepared using Dragon voice recognition software and may include unintentional dictation errors.  This document serves as a record of services personally performed by DMellody Dance MD. It was created on her behalf by LGeorga Bora a trained medical scribe. The creation of this record is based on the scribe's personal observations and the provider's statements to them.   I have reviewed the above medical documentation for accuracy and completeness and I concur.  DMellody Dance D.O.      ----------------------------------------------------------------------------------------------------------------------  Subjective:   CC:   Amber WIGINTONis a 22y.o. female [facility] and discussion of recent bloodwork that was done in addition to f/up on chronic conditions we are managing for pt.  All recent blood work that we ordered was reviewed with patient today.  Patient was counseled on all abnormalities and we discussed dietary and lifestyle changes that could help those values (also medications when appropriate).  Extensive health counseling performed and all patient's concerns/ questions were addressed.  See labs below and also plan for more details of these abnormalities  Dermatology -Pt states she has always had excessive  sweating -States she has been to dermatology but it was when she was a kid -Says they told her about possible botox and suggested Deoderant to use -Says the sweating is in the bends of her arms and knees as well -states she also is losing hair "excessively" -Pt has no bald spots;  Hair is mid back length  Mood -Pt states that she has seen positive effects of the 761m but the medication still isn't quite enough -Says she has not SE with the medication -Feels sometimes her medication is not enough to keep her mood elevated especially on bad days  Lab Results 06/01/2018 CBC WBC 8.4 RBC 4.76 Hemoglobin 14.3 Hematocrit 41.7 Platelets 360  Lipid HDL 34 LDL 117 Triglycerides 13527Metabolic Ser/creatinine 0.7.82FR 96 Calcium 9.4 Potassium 5.2 ALT 15 AST 19  Thyroid TSH 2.370 T3 3.2 T4 1.33  Vitamins D 39 B12 430   Wt Readings from Last 3 Encounters:  06/20/18 148 lb 11.2 oz (67.4 kg)  04/18/18 148 lb 14.4 oz (67.5 kg)  01/10/18 149 lb 12 oz (67.9 kg)   BP Readings from Last 3 Encounters:  06/20/18 131/90  04/18/18 119/82  01/10/18 106/66   Pulse Readings from Last 3 Encounters:  06/20/18 97  04/18/18 74  01/10/18 84   BMI Readings from Last 3 Encounters:  06/20/18 27.20 kg/m  04/18/18 27.23 kg/m  01/10/18 27.17 kg/m     Patient Care Team    Relationship Specialty Notifications Start End  OpMellody DanceDO PCP - General Family Medicine  04/18/18   [facility]    04/18/18     Full medical history updated and reviewed in the office today  Patient Active Problem List   Diagnosis Date Noted  . Excessive sweating 06/20/2018  . Loss of hair 06/20/2018  . Thin nails 06/20/2018  . GERD (gastroesophageal reflux disease) 04/18/2018  . Hypothyroidism 04/18/2018  . Generalized arthritis 04/18/2018  . Environmental and seasonal allergies 04/18/2018  . Transient gluten sensitivity 04/18/2018  . Other fatigue 04/18/2018  .  Overweight (BMI 25.0-29.9) 04/18/2018  . GAD (generalized anxiety disorder) 01/10/2018  . Preventative health care 01/12/2016  . Rash and nonspecific skin eruption 01/12/2016  . ADHD (attention deficit hyperactivity disorder) 08/11/2015    Past Medical History:  Diagnosis Date  . ADHD (attention deficit hyperactivity disorder)   . GERD (gastroesophageal reflux disease)   . Thyroid disease     History reviewed. No pertinent surgical history.  Social History   Tobacco Use  . Smoking status: Never Smoker  . Smokeless tobacco: Never Used  Substance Use Topics  . Alcohol use: No    Alcohol/week: 0.0 standard drinks    Family Hx: Family History  Problem Relation Age of Onset  . Hyperlipidemia Father   . Hypothyroidism Maternal Grandmother   . Hashimoto's thyroiditis Maternal Grandmother      Medications: Current Outpatient Medications  Medication Sig Dispense Refill  . [START ON 07/19/2018] amphetamine-dextroamphetamine (ADDERALL XR) 10 MG 24 hr capsule Take 1 capsule (10 mg total) by mouth daily. 91 capsule 0  . cetirizine (ZYRTEC) 10 MG tablet Take 10 mg by mouth daily as needed for allergies.    Marland Kitchen levothyroxine (SYNTHROID, LEVOTHROID) 50 MCG tablet Take 1 tablet (50 mcg total) by mouth daily. 90 tablet 1  . meloxicam (MOBIC) 15 MG tablet Take 1 tablet (15 mg total) by mouth daily. 30 tablet 3  . Multiple Vitamins-Calcium (ONE-A-DAY WOMENS FORMULA PO) Take 1 tablet by mouth daily.    . Biotin w/ Vitamins C & E (HAIR/SKIN/NAILS PO) Take 1 tablet by mouth daily.    Marland Kitchen buPROPion (WELLBUTRIN XL) 150 MG 24 hr tablet Take 1 tablet (150 mg total) by mouth every morning. 90 tablet 0   No current facility-administered medications for this visit.     Allergies:  No Known Allergies   Review of Systems: General:   No F/C, wt loss Pulm:   No DIB, SOB, pleuritic chest pain Card:  No CP, palpitations Abd:  No n/v/d or pain Ext:  No inc edema from baseline  Objective:  Blood  pressure 131/90, pulse 97, height 5' 2"  (1.575 m), weight 148 lb 11.2 oz (67.4 kg), SpO2 99 %. Body mass index is 27.2 kg/m. Gen:   Well NAD, A and O *3 HEENT:    [place]/AT, EOMI,  MMM Lungs:   Normal work of breathing. CTA B/L, no Wh, rhonchi Heart:   RRR, S1, S2 WNL's, no MRG Abd:   No gross distention Exts:    warm, pink,  Brisk capillary refill, warm and well perfused.  Psych:    No HI/SI, judgement and insight good, Euthymic mood. Full Affect.   Recent Results (from the past 2160 hour(s))  Vitamin B12     Status: None   Collection Time: 06/01/18  9:31 AM  Result Value Ref Range   Vitamin B-12 430 232 - 1,245 pg/mL  VITAMIN D 25 Hydroxy (Vit-D Deficiency, Fractures)     Status: None   Collection Time: 06/01/18  9:31 AM  Result Value Ref Range  Vit D, 25-Hydroxy 39.0 30.0 - 100.0 ng/mL    Comment: Vitamin D deficiency has been defined by the War practice guideline as a level of serum 25-OH vitamin D less than 20 ng/mL (1,2). The Endocrine Society went on to further define vitamin D insufficiency as a level between 21 and 29 ng/mL (2). 1. IOM (Institute of Medicine). 2010. Dietary reference    intakes for calcium and D. [place]: The    [employer]. 2. Holick MF, Binkley Sunshine, Bischoff-Ferrari HA, et al.    Evaluation, treatment, and prevention of vitamin D    deficiency: an Endocrine Society clinical practice    guideline. JCEM. 2011 Jul; 96(7):1911-30.   TSH     Status: None   Collection Time: 06/01/18  9:31 AM  Result Value Ref Range   TSH 2.370 0.450 - 4.500 uIU/mL  T4, free     Status: None   Collection Time: 06/01/18  9:31 AM  Result Value Ref Range   Free T4 1.33 0.82 - 1.77 ng/dL  T3, free     Status: None   Collection Time: 06/01/18  9:31 AM  Result Value Ref Range   T3, Free 3.2 2.0 - 4.4 pg/mL  Hemoglobin A1c     Status: None   Collection Time: 06/01/18  9:31 AM  Result Value Ref Range   Hgb A1c MFr  Bld 5.0 4.8 - 5.6 %    Comment:          Prediabetes: 5.7 - 6.4          Diabetes: >6.4          Glycemic control for adults with diabetes: <7.0    Est. average glucose Bld gHb Est-mCnc 97 mg/dL  Lipid panel     Status: Abnormal   Collection Time: 06/01/18  9:31 AM  Result Value Ref Range   Cholesterol, Total 179 100 - 199 mg/dL   Triglycerides 139 0 - 149 mg/dL   HDL 34 (L) >39 mg/dL   VLDL Cholesterol Cal 28 5 - 40 mg/dL   LDL Calculated 117 (H) 0 - 99 mg/dL   Chol/HDL Ratio 5.3 (H) 0.0 - 4.4 ratio    Comment:                                   T. Chol/HDL Ratio                                             Men  Women                               1/2 Avg.Risk  3.4    3.3                                   Avg.Risk  5.0    4.4                                2X Avg.Risk  9.6    7.1  3X Avg.Risk 23.4   11.0   Comprehensive metabolic panel     Status: None   Collection Time: 06/01/18  9:31 AM  Result Value Ref Range   Glucose 78 65 - 99 mg/dL   BUN 11 6 - 20 mg/dL   Creatinine, Ser 0.86 0.57 - 1.00 mg/dL   GFR calc non Af Amer 96 >59 mL/min/1.73   GFR calc Af Amer 110 >59 mL/min/1.73   BUN/Creatinine Ratio 13 9 - 23   Sodium 142 134 - 144 mmol/L   Potassium 5.2 3.5 - 5.2 mmol/L   Chloride 102 96 - 106 mmol/L   CO2 25 20 - 29 mmol/L   Calcium 9.4 8.7 - 10.2 mg/dL   Total Protein 6.9 6.0 - 8.5 g/dL   Albumin 4.5 3.5 - 5.5 g/dL   Globulin, Total 2.4 1.5 - 4.5 g/dL   Albumin/Globulin Ratio 1.9 1.2 - 2.2   Bilirubin Total 0.4 0.0 - 1.2 mg/dL   Alkaline Phosphatase 105 39 - 117 IU/L   AST 19 0 - 40 IU/L   ALT 15 0 - 32 IU/L  CBC with Differential/Platelet     Status: Abnormal   Collection Time: 06/01/18  9:31 AM  Result Value Ref Range   WBC 8.4 3.4 - 10.8 x10E3/uL   RBC 4.76 3.77 - 5.28 x10E6/uL   Hemoglobin 14.3 11.1 - 15.9 g/dL   Hematocrit 41.7 34.0 - 46.6 %   MCV 88 79 - 97 fL   MCH 30.0 26.6 - 33.0 pg   MCHC 34.3 31.5 - 35.7 g/dL   RDW  12.2 (L) 12.3 - 15.4 %   Platelets 360 150 - 450 x10E3/uL   Neutrophils 65 Not Estab. %   Lymphs 26 Not Estab. %   Monocytes 5 Not Estab. %   Eos 3 Not Estab. %   Basos 1 Not Estab. %   Neutrophils Absolute 5.5 1.4 - 7.0 x10E3/uL   Lymphocytes Absolute 2.2 0.7 - 3.1 x10E3/uL   Monocytes Absolute 0.4 0.1 - 0.9 x10E3/uL   EOS (ABSOLUTE) 0.2 0.0 - 0.4 x10E3/uL   Basophils Absolute 0.1 0.0 - 0.2 x10E3/uL   Immature Granulocytes 0 Not Estab. %   Immature Grans (Abs) 0.0 0.0 - 0.1 x10E3/uL

## 2018-06-29 ENCOUNTER — Telehealth: Payer: Self-pay | Admitting: Family Medicine

## 2018-06-29 NOTE — Telephone Encounter (Signed)
Patient called states picked up New Rx :  buPROPion (WELLBUTRIN XL) 150 MG 24 hr tablet [[id number]]   Order Details  Dose: 150 mg Route: Oral Frequency: BH-each morning  Dispense Quantity: 90 tablet Refills: 0 Fills remaining: --        Sig: Take 1 tablet (150 mg total) by mouth every morning.     ----Per Patient also still has the (Old Rx for the 75 MG of Wellbutrin) doesn't know if she is to complete it  & then start the New Rx Script.  ---Please advise @ [phone]  --Forwarding message to medical assistant.  --Dion Body

## 2018-06-29 NOTE — Telephone Encounter (Signed)
Called and left patient message to call the office. MPulliam, CMA/RT(R)

## 2018-07-03 NOTE — Telephone Encounter (Signed)
Spoke to patient and advised per Dr. Raliegh Scarlet to take 2 tab of the 75 mg of Wellbutrin bid until that RX is complete and to start the new dose of Wellbutrin XR 142m 1 tablet in the AM daily.  Patient expressed understanding. MPulliam, CMA/RT(R)

## 2018-07-11 ENCOUNTER — Telehealth: Payer: Self-pay | Admitting: Family Medicine

## 2018-07-11 DIAGNOSIS — Z23 Encounter for immunization: Secondary | ICD-10-CM | POA: Diagnosis not present

## 2018-07-11 DIAGNOSIS — R61 Generalized hyperhidrosis: Secondary | ICD-10-CM | POA: Diagnosis not present

## 2018-07-11 NOTE — Telephone Encounter (Signed)
Patient is requesting a call back from nurse about thyroid med, she has a few questions about the best time to take this med with her daily routine and wants to make sure she is doing what is best. Please advise and call patient at [phone]

## 2018-07-12 NOTE — Telephone Encounter (Signed)
Spoke to patient Per Dr. Raliegh Scarlet do not take with other medications and due to Adderall being on of her meds that has to be taken in the AM it is advised to take the synthroid at night. MPulliam, CMA/RT(R)

## 2018-09-11 ENCOUNTER — Other Ambulatory Visit: Payer: Self-pay | Admitting: Family Medicine

## 2018-09-11 DIAGNOSIS — F908 Attention-deficit hyperactivity disorder, other type: Secondary | ICD-10-CM

## 2018-09-11 DIAGNOSIS — F411 Generalized anxiety disorder: Secondary | ICD-10-CM

## 2018-09-21 ENCOUNTER — Ambulatory Visit: Payer: Self-pay | Admitting: Family Medicine

## 2018-10-01 ENCOUNTER — Encounter: Payer: Self-pay | Admitting: Family Medicine

## 2018-10-01 ENCOUNTER — Ambulatory Visit (INDEPENDENT_AMBULATORY_CARE_PROVIDER_SITE_OTHER): Payer: BLUE CROSS/BLUE SHIELD | Admitting: Family Medicine

## 2018-10-01 VITALS — BP 116/79 | HR 81 | Temp 98.4°F | Ht 62.0 in | Wt 155.7 lb

## 2018-10-01 DIAGNOSIS — F411 Generalized anxiety disorder: Secondary | ICD-10-CM

## 2018-10-01 DIAGNOSIS — F908 Attention-deficit hyperactivity disorder, other type: Secondary | ICD-10-CM | POA: Diagnosis not present

## 2018-10-01 DIAGNOSIS — R5383 Other fatigue: Secondary | ICD-10-CM

## 2018-10-01 MED ORDER — AMPHETAMINE-DEXTROAMPHET ER 10 MG PO CP24: ORAL_CAPSULE | ORAL | 0 refills | Status: DC

## 2018-10-01 NOTE — Patient Instructions (Addendum)
-We are going up on your Adderall to twice daily.  New prescription was sent to your pharmacy.  Please follow-up every 90 days for this, or sooner if issues      Generalized Anxiety Disorder, Adult  Generalized anxiety disorder (GAD) is a mental health disorder. People with this condition constantly worry about everyday events. Unlike normal anxiety, worry related to GAD is not triggered by a specific event. These worries also do not fade or get better with time. GAD interferes with life functions, including relationships, work, and school. GAD can vary from mild to severe. People with severe GAD can have intense waves of anxiety with physical symptoms (panic attacks). What are the causes? The exact cause of GAD is not known. What increases the risk? This condition is more likely to develop in:  Women.  People who have a family history of anxiety disorders.  People who are very shy.  People who experience very stressful life events, such as the death of a loved one.  People who have a very stressful family environment.  What are the signs or symptoms? People with GAD often worry excessively about many things in their lives, such as their health and family. They may also be overly concerned about:  Doing well at work.  Being on time.  Natural disasters.  Friendships.  Physical symptoms of GAD include:  Fatigue.  Muscle tension or having muscle twitches.  Trembling or feeling shaky.  Being easily startled.  Feeling like your heart is pounding or racing.  Feeling out of breath or like you cannot take a deep breath.  Having trouble falling asleep or staying asleep.  Sweating.  Nausea, diarrhea, or irritable bowel syndrome (IBS).  Headaches.  Trouble concentrating or remembering facts.  Restlessness.  Irritability.  How is this diagnosed? Your health care provider can diagnose GAD based on your symptoms and medical history. You will also have a physical  exam. The health care provider will ask specific questions about your symptoms, including how severe they are, when they started, and if they come and go. Your health care provider may ask you about your use of alcohol or drugs, including prescription medicines. Your health care provider may refer you to a mental health specialist for further evaluation. Your health care provider will do a thorough examination and may perform additional tests to rule out other possible causes of your symptoms. To be diagnosed with GAD, a person must have anxiety that:  Is out of his or her control.  Affects several different aspects of his or her life, such as work and relationships.  Causes distress that makes him or her unable to take part in normal activities.  Includes at least three physical symptoms of GAD, such as restlessness, fatigue, trouble concentrating, irritability, muscle tension, or sleep problems.  Before your health care provider can confirm a diagnosis of GAD, these symptoms must be present more days than they are not, and they must last for six months or longer. How is this treated? The following therapies are usually used to treat GAD:  Medicine. Antidepressant medicine is usually prescribed for long-term daily control. Antianxiety medicines may be added in severe cases, especially when panic attacks occur.  Talk therapy (psychotherapy). Certain types of talk therapy can be helpful in treating GAD by providing support, education, and guidance. Options include: ? Cognitive behavioral therapy (CBT). People learn coping skills and techniques to ease their anxiety. They learn to identify unrealistic or negative thoughts and behaviors and to  replace them with positive ones. ? Acceptance and commitment therapy (ACT). This treatment teaches people how to be mindful as a way to cope with unwanted thoughts and feelings. ? Biofeedback. This process trains you to manage your body's response  (physiological response) through breathing techniques and relaxation methods. You will work with a therapist while machines are used to monitor your physical symptoms.  Stress management techniques. These include yoga, meditation, and exercise.  A mental health specialist can help determine which treatment is best for you. Some people see improvement with one type of therapy. However, other people require a combination of therapies. Follow these instructions at home:  Take over-the-counter and prescription medicines only as told by your health care provider.  Try to maintain a normal routine.  Try to anticipate stressful situations and allow extra time to manage them.  Practice any stress management or self-calming techniques as taught by your health care provider.  Do not punish yourself for setbacks or for not making progress.  Try to recognize your accomplishments, even if they are small.  Keep all follow-up visits as told by your health care provider. This is important. Contact a health care provider if:  Your symptoms do not get better.  Your symptoms get worse.  You have signs of depression, such as: ? A persistently sad, cranky, or irritable mood. ? Loss of enjoyment in activities that used to bring you joy. ? Change in weight or eating. ? Changes in sleeping habits. ? Avoiding friends or family members. ? Loss of energy for normal tasks. ? Feelings of guilt or worthlessness. Get help right away if:  You have serious thoughts about hurting yourself or others. If you ever feel like you may hurt yourself or others, or have thoughts about taking your own life, get help right away. You can go to your nearest emergency department or call:  Your local emergency services (911 in the [place]  A suicide crisis helpline, such as the [facility] at (475)318-0043. This is open 24 hours a day.  Summary  Generalized anxiety disorder (GAD) is a mental  health disorder that involves worry that is not triggered by a specific event.  People with GAD often worry excessively about many things in their lives, such as their health and family.  GAD may cause physical symptoms such as restlessness, trouble concentrating, sleep problems, frequent sweating, nausea, diarrhea, headaches, and trembling or muscle twitching.  A mental health specialist can help determine which treatment is best for you. Some people see improvement with one type of therapy. However, other people require a combination of therapies. This information is not intended to replace advice given to you by your health care provider. Make sure you discuss any questions you have with your health care provider. Document Released: 12/10/2012 Document Revised: 07/05/2016 Document Reviewed: 07/05/2016 Elsevier Interactive Patient Education  2018 [place]  After being diagnosed with an anxiety disorder, you may be relieved to know why you have felt or behaved a certain way. It is natural to also feel overwhelmed about the treatment ahead and what it will mean for your life. With care and support, you can manage this condition and recover from it. How to cope with anxiety Dealing with stress Stress is your body's reaction to life changes and events, both good and bad. Stress can last just a few hours or it can be ongoing. Stress can play a major role in anxiety, so  it is important to learn both how to cope with stress and how to think about it differently. Talk with your health care provider or a counselor to learn more about stress reduction. He or she may suggest some stress reduction techniques, such as:  Music therapy. This can include creating or listening to music that you enjoy and that inspires you.  Mindfulness-based meditation. This involves being aware of your normal breaths, rather than trying to control your breathing. It can be done while sitting  or walking.  Centering prayer. This is a kind of meditation that involves focusing on a word, phrase, or sacred image that is meaningful to you and that brings you peace.  Deep breathing. To do this, expand your stomach and inhale slowly through your nose. Hold your breath for 3-5 seconds. Then exhale slowly, allowing your stomach muscles to relax.  Self-talk. This is a skill where you identify thought patterns that lead to anxiety reactions and correct those thoughts.  Muscle relaxation. This involves tensing muscles then relaxing them.  Choose a stress reduction technique that fits your lifestyle and personality. Stress reduction techniques take time and practice. Set aside 5-15 minutes a day to do them. Therapists can offer training in these techniques. The training may be covered by some insurance plans. Other things you can do to manage stress include:  Keeping a stress diary. This can help you learn what triggers your stress and ways to control your response.  Thinking about how you respond to certain situations. You may not be able to control everything, but you can control your reaction.  Making time for activities that help you relax, and not feeling guilty about spending your time in this way.  Therapy combined with coping and stress-reduction skills provides the best chance for successful treatment. Medicines Medicines can help ease symptoms. Medicines for anxiety include:  Anti-anxiety drugs.  Antidepressants.  Beta-blockers.  Medicines may be used as the main treatment for anxiety disorder, along with therapy, or if other treatments are not working. Medicines should be prescribed by a health care provider. Relationships Relationships can play a big part in helping you recover. Try to spend more time connecting with trusted friends and family members. Consider going to couples counseling, taking family education classes, or going to family therapy. Therapy can help you and  others better understand the condition. How to recognize changes in your condition Everyone has a different response to treatment for anxiety. Recovery from anxiety happens when symptoms decrease and stop interfering with your daily activities at home or work. This may mean that you will start to:  Have better concentration and focus.  Sleep better.  Be less irritable.  Have more energy.  Have improved memory.  It is important to recognize when your condition is getting worse. Contact your health care provider if your symptoms interfere with home or work and you do not feel like your condition is improving. Where to find help and support: You can get help and support from these sources:  Self-help groups.  Online and [employer].  A trusted spiritual leader.  Couples counseling.  Family education classes.  Family therapy.  Follow these instructions at home:  Eat a healthy diet that includes plenty of vegetables, fruits, whole grains, low-fat dairy products, and lean protein. Do not eat a lot of foods that are high in solid fats, added sugars, or salt.  Exercise. Most adults should do the following: ? Exercise for at least 150 minutes each week.  The exercise should increase your heart rate and make you sweat (moderate-intensity exercise). ? Strengthening exercises at least twice a week.  Cut down on caffeine, tobacco, alcohol, and other potentially harmful substances.  Get the right amount and quality of sleep. Most adults need 7-9 hours of sleep each night.  Make choices that simplify your life.  Take over-the-counter and prescription medicines only as told by your health care provider.  Avoid caffeine, alcohol, and certain over-the-counter cold medicines. These may make you feel worse. Ask your pharmacist which medicines to avoid.  Keep all follow-up visits as told by your health care provider. This is important. Questions to ask your health care  provider  Would I benefit from therapy?  How often should I follow up with a health care provider?  How long do I need to take medicine?  Are there any long-term side effects of my medicine?  Are there any alternatives to taking medicine? Contact a health care provider if:  You have a hard time staying focused or finishing daily tasks.  You spend many hours a day feeling worried about everyday life.  You become exhausted by worry.  You start to have headaches, feel tense, or have nausea.  You urinate more than normal.  You have diarrhea. Get help right away if:  You have a racing heart and shortness of breath.  You have thoughts of hurting yourself or others. If you ever feel like you may hurt yourself or others, or have thoughts about taking your own life, get help right away. You can go to your nearest emergency department or call:  Your local emergency services (911 in the [place]  A suicide crisis helpline, such as the [facility] at 308-132-6209. This is open 24-hours a day.  Summary  Taking steps to deal with stress can help calm you.  Medicines cannot cure anxiety disorders, but they can help ease symptoms.  Family, friends, and partners can play a big part in helping you recover from an anxiety disorder. This information is not intended to replace advice given to you by your health care provider. Make sure you discuss any questions you have with your health care provider. Document Released: 08/09/2016 Document Revised: 08/09/2016 Document Reviewed: 08/09/2016 Elsevier Interactive Patient Education  [employer].

## 2018-10-01 NOTE — Progress Notes (Signed)
ADHD/ ADD OV note  Impression and Recommendations:    1. Attention deficit hyperactivity disorder (ADHD), other type   2. GAD (generalized anxiety disorder)   3. Other fatigue     1. Attention Deficit Hyperactivity Disorder - Sx not optimally managed at this time. - Discussed that coming off of Wellbutrin will affect ADHD symptoms.  - Recommended change in treatment plan today.  See med list below.  - Increase dose to two 10 mg pills daily. - Take one dose of 10 mg in the morning, and second dose in the afternoon.  - Patient tolerating meds well without complication.  Denies S-E  - Encouraged prudent practices, such as healthy dietary habits and regular exercise to help improve mood and ADD.  - Encouraged patient to seek lifestyle assistance through the ADD[employer] website.  - STRONGLY advised patient to discontinue use of caffeine.  - Patient knows to keep an eye on herself on her increased dose of medication, and let us know if she experiences concerns.  2. Mood Management - GAD - Patient discontinued Wellbutrin after increasing dose. - Per patient, the increased dose of Wellbutrin made her feel "drunk and dizzy."  - Discussed that if patient's mood concerns and anxiety come back, we can transition patient to a new mood management medication. - However, discussed that if patient is feeling well off of the Wellbutrin, she does not need to take medication.  - Reviewed the "spokes of the wheel" of mood and health management.  Stressed the importance of ongoing prudent habits, including regular exercise, appropriate sleep hygiene, healthful dietary habits, and prayer/meditation to relax.  - Will continue to monitor.  - Patient knows to keep an eye on herself and let us know if she experiences issues.  3. BMI Counseling - BMI of 28.48 Explained to patient what BMI refers to, and what it means medically.    Told patient to think about it as a "medical risk  stratification measurement" and how increasing BMI is associated with increasing risk/ or worsening state of various diseases such as hypertension, hyperlipidemia, diabetes, premature OA, depression etc.  American Heart Association guidelines for healthy diet, basically Mediterranean diet, and exercise guidelines of 30 minutes 5 days per week or more discussed in detail.  Health counseling performed.  All questions answered.  4. Lifestyle & Preventative Health Maintenance - Advised patient to continue working toward exercising to improve overall mental, physical, and emotional health.    - Encouraged patient to engage in daily physical activity, especially a formal exercise routine.  Recommended that the patient eventually strive for at least 150 minutes of moderate cardiovascular activity per week according to guidelines established by the [facility]   - Healthy dietary habits encouraged, including low-carb, and high amounts of lean protein in diet.   - Patient should also consume adequate amounts of water.   Education and routine counseling performed. Handouts provided if pt desired.    Meds ordered this encounter  Medications  . amphetamine-dextroamphetamine (ADDERALL XR) 10 MG 24 hr capsule    Sig: 1 tab 7 AM and 1 tab around 1 PM in afternoon    Dispense:  180 capsule    Refill:  0     Medications Discontinued During This Encounter  Medication Reason  . buPROPion (WELLBUTRIN XL) 150 MG 24 hr tablet Patient has not taken in last 30 days  . amphetamine-dextroamphetamine (ADDERALL XR) 10 MG 24 hr capsule Reorder  Return for ADD/ ADHD recheck every 41mo   --> we doubled her Adderall 10 mg XR up to twice daily up from once daily   -Reminded patient the need for yearly complete physical exam office visits in addition to office visits for management of the chronic diseases  -Gross side effects, risk and benefits, and alternatives of medications discussed with patient.  Patient  is aware that all medications have potential side effects and we are unable to predict every side effect or drug-drug interaction that may occur.  Expresses verbal understanding and consents to current therapy plan and treatment regimen.   Please see AVS handed out to patient at the end of our visit for further patient instructions/ counseling done pertaining to today's office visit.    Note:  This document was prepared using Dragon voice recognition software and may include unintentional dictation errors.  This document serves as a record of services personally performed by DMellody Dance DO. It was created on her behalf by KToni Amend a trained medical scribe. The creation of this record is based on the scribe's personal observations and the provider's statements to them.   I have reviewed the above medical documentation for accuracy and completeness and I concur.  DMellody Dance DO 10/01/2018 4:28 PM      ______________________________________________________________________    Subjective:  HPI: Amber Murillo.o. female  presents for 3 month follow up for evaluation of our treatment plan for pt's ADD/ ADHD.  Has never had any issues with her blood pressure.  Confirms sleeping okay.  States "I haven't really had any trouble sleeping; getting up in the morning is a problem."  Feels she might be feeling more anxious since starting school again.  States she typically drinks three cups of coffee in the morning.  Mood Management - Discontinued Wellbutrin Stopped taking the Wellbutrin because it made her feel "drunk and really dizzy" on the higher dose.  Says that she struggled with even driving.  Instead of calling the clinic, the patient discontinued the medication.  Patient did tolerate the 75 mg dose, but "still felt fidgety, still felt that ADHD feeling kicking in, and I was struggling."  She stopped taking the higher dose of Wellbutrin about a month ago.  Feels  her weight hasn't changed, and has mainly "stayed the same."  Attention Deficit & Focus Has been taking Adderall  School started back in January; notes she couldn't concentrate in class, kept fidgeting with things.  She has not increased her dose of Adderall.  States she's been exercising, and has increased her cardio to three times per week, for 45 minutes to an hour.  However, she does not like cardio.  Notes she feels more "completely out of it" during her afternoon classes.   No problems updated.   Wt Readings from Last 3 Encounters:  10/01/18 155 lb 11.2 oz (70.6 kg)  06/20/18 148 lb 11.2 oz (67.4 kg)  04/18/18 148 lb 14.4 oz (67.5 kg)    BMI Readings from Last 3 Encounters:  10/01/18 28.48 kg/m  06/20/18 27.20 kg/m  04/18/18 27.23 kg/m    BP Readings from Last 3 Encounters:  10/01/18 116/79  06/20/18 131/90  04/18/18 119/82   Depression screen PHQ 2/9 10/01/2018 06/20/2018 04/18/2018  Decreased Interest 0 0 0  Down, Depressed, Hopeless 0 0 0  PHQ - 2 Score 0 0 0  Altered sleeping 0 2 0  Tired, decreased energy 1 1 3   Change in appetite 0 1 0  Feeling bad or failure about yourself  0 0 0  Trouble concentrating 3 3 3   Moving slowly or fidgety/restless 1 3 0  Suicidal thoughts 0 0 0  PHQ-9 Score 5 10 6   Difficult doing work/chores Somewhat difficult - Very difficult   GAD 7 : Generalized Anxiety Score 04/18/2018 01/10/2018  Nervous, Anxious, on Edge 1 2  Control/stop worrying 0 0  Worry too much - different things 0 0  Trouble relaxing 2 3  Restless 3 3  Easily annoyed or irritable 2 3  Afraid - awful might happen 0 0  Total GAD 7 Score 8 11  Anxiety Difficulty Very difficult Somewhat difficult      Review of Systems: General:   No F/C, wt loss Pulm:   No DIB, SOB, pleuritic chest pain Card:  No CP, palpitations Abd:  No n/v/d or pain Ext:  No inc edema from baseline   Objective: Physical Exam: BP 116/79   Pulse 81   Temp 98.4 F (36.9 C)    Ht 5' 2"  (1.575 m)   Wt 155 lb 11.2 oz (70.6 kg)   SpO2 99%   BMI 28.48 kg/m  Body mass index is 28.48 kg/m. General: Well nourished, in no apparent distress. Eyes: PERRLA, EOMs, conjunctiva clr no swelling or erythema Neck: supple Resp: Respiratory effort- normal, ECTA B/L w/o W/R/R  Cardio: RRR w/o MRGs. Skin: Warm, dry without rashes, lesions, ecchymosis.  Neuro: Alert, Oriented Psych: Normal affect, Insight and Judgment appropriate.    Current Medications:  Current Outpatient Medications on File Prior to Visit  Medication Sig Dispense Refill  . Biotin w/ Vitamins C & E (HAIR/SKIN/NAILS PO) Take 1 tablet by mouth daily.    . cetirizine (ZYRTEC) 10 MG tablet Take 10 mg by mouth daily as needed for allergies.    Marland Kitchen levothyroxine (SYNTHROID, LEVOTHROID) 50 MCG tablet Take 1 tablet (50 mcg total) by mouth daily. 90 tablet 1  . meloxicam (MOBIC) 15 MG tablet Take 1 tablet (15 mg total) by mouth daily. 30 tablet 3  . Multiple Vitamins-Calcium (ONE-A-DAY WOMENS FORMULA PO) Take 1 tablet by mouth daily.     No current facility-administered medications on file prior to visit.     Medical History:  Patient Active Problem List   Diagnosis Date Noted  . Excessive sweating 06/20/2018  . Loss of hair 06/20/2018  . Thin nails 06/20/2018  . GERD (gastroesophageal reflux disease) 04/18/2018  . Hypothyroidism 04/18/2018  . Generalized arthritis 04/18/2018  . Environmental and seasonal allergies 04/18/2018  . Transient gluten sensitivity 04/18/2018  . Other fatigue 04/18/2018  . Overweight (BMI 25.0-29.9) 04/18/2018  . GAD (generalized anxiety disorder) 01/10/2018  . Preventative health care 01/12/2016  . Rash and nonspecific skin eruption 01/12/2016  . ADHD (attention deficit hyperactivity disorder) 08/11/2015    Allergies:  No Known Allergies   Family history-  Reviewed; changed as appropriate  Social history-  Reviewed; changed as appropriate

## 2018-10-11 ENCOUNTER — Telehealth: Payer: Self-pay | Admitting: Family Medicine

## 2018-10-11 NOTE — Telephone Encounter (Signed)
Patient was seen on 10/01/2018.  Please review and advise. MPulliam, CMA/RT(R)

## 2018-10-11 NOTE — Telephone Encounter (Signed)
Patient says Dr. Jenetta Downer advised her to call office regarding cost of this Rx :   amphetamine-dextroamphetamine (ADDERALL XR) 10 MG 24 hr capsule [[id number]]   Order Details  Dose, Route, Frequency: As Directed   Indications of Use: Attention Deficit Hyperactivity Disorder  Dispense Quantity: 180 capsule Refills: 0 Fills remaining: --        Sig: 1 tab 7 AM and 1 tab around 1 PM in afternoon     -----Per patient if price too much provider would review for adjustment/ tweaking----Per patient cost of Rx capsules are 180 way too much for her to afford.  ----Forwarding message to medical assistant to review changes w/provider & call new Rx to :    [phone](Phone) [phone](Fax)   --glh

## 2018-10-11 NOTE — Telephone Encounter (Signed)
Please find out from patient or pharmacy which ones are cheaper and we can write for the ones her insurance approves, once the controlled substance website is looked at and confirmed when last prescriptions were dispensed and picked up etc.

## 2018-10-12 NOTE — Telephone Encounter (Signed)
Patient has contacted the insurance and is waiting for a call back.  Patient will let our office know which medication. MPulliam, CMA/RT(R)

## 2018-10-18 ENCOUNTER — Telehealth: Payer: Self-pay | Admitting: Family Medicine

## 2018-10-18 NOTE — Telephone Encounter (Signed)
Patient called, states she continues to gain weight even after medication tweaking-- Patient request provider / nurse call her back to discuss possible switching of Rxs(per patient Provider increased Adderall, took off Wellbutrin & she is still taking the Synthyroid.) doesn't understand why the continued weight gain.    ---Forwarding request to medical assistant for review w/ provider & to call patient back @ [phone].  --glh

## 2018-10-19 NOTE — Telephone Encounter (Signed)
Spoke to the patient and she states that she has had about a 20 pound weight gain in about a year and a half.  Patient is eating a healthy diet and is working out everyday and has increased amount of cardio.  Patient is doing weight training and crossfit.  Patient has not taken the Wellbutrin in 2 months.  Patient is concern with the continued weight gain.  Patient is not taking BC pills she is currently using an IUD for birth control.  Please review and advise. MPulliam, CMA/RT(R)

## 2018-10-24 NOTE — Telephone Encounter (Signed)
I rec she eat close to 50% of her total daily calories in healthy Proteins, and eat less carbs.  I am not sure what her dietary wheel looks like, but if it doesn't look like that, it needs to

## 2018-10-24 NOTE — Telephone Encounter (Signed)
I talked to her in the past about going ahead and tracking everything she takes in including even a bite of something!  If it goes in her mouth, it needs to be written down exactly how much she ate etc.     I recommend she weigh her foods to make sure she knows exactly how much she is taking in or measure everything.     That is the only way to know what she is doing right and doing wrong with her diet.  I recommend she do this for 1 to 2 months and then make a follow-up so we can go over it.     Also, please let the patient know that I was out of the office and this is the first day on back.  Apologize for the delay in response

## 2018-10-24 NOTE — Telephone Encounter (Signed)
Called patient and she states that she has been keeping a log of intake and weight her for food for 1 month now. Patient states that she still has not lost weight.  Please advise if you want her to continue to track food and follow up in May or if you would like for her to come in sooner.  Patient would to weight if diet is all that needs to be reviewed at this time.  But patient is happy to come in if you feel like labs or medication may be needed. MPulliam, CMA/RT(R)

## 2018-10-26 NOTE — Telephone Encounter (Signed)
Called patient unable to reach. MPulliam, CMA/RT(R)

## 2018-10-30 NOTE — Telephone Encounter (Signed)
Patient notified. MPulliam, CMA/RT(R)  

## 2018-11-05 ENCOUNTER — Telehealth: Payer: Self-pay | Admitting: Family Medicine

## 2018-11-05 NOTE — Telephone Encounter (Signed)
Patient called states new meds for ADHD not making different in symptoms & she was advised to call office if changes or not with symptoms.--glh

## 2018-11-05 NOTE — Telephone Encounter (Signed)
Patient called states that there is very little to know improvement in ADHD symptoms.  Patient last seen pm 10/01/2018 and Adderall was increased at that time to 10 mg bid.  [place] controlled substance database was reviewed and printed.  Please review and advise.

## 2018-11-06 NOTE — Telephone Encounter (Signed)
I rec inc in dose to see if helps at this time.  Pt will need to bring in all used meds prior to me sending new script for different dose.

## 2018-11-07 ENCOUNTER — Other Ambulatory Visit: Payer: Self-pay

## 2018-11-07 ENCOUNTER — Telehealth: Payer: Self-pay

## 2018-11-07 DIAGNOSIS — F908 Attention-deficit hyperactivity disorder, other type: Secondary | ICD-10-CM

## 2018-11-07 DIAGNOSIS — F411 Generalized anxiety disorder: Secondary | ICD-10-CM

## 2018-11-07 MED ORDER — BUPROPION HCL ER (XL) 150 MG PO TB24: ORAL_TABLET | ORAL | 0 refills | Status: DC

## 2018-11-07 NOTE — Telephone Encounter (Signed)
Sent in RX and notified patient. MPulliam, CMA/RT(R)

## 2018-11-07 NOTE — Telephone Encounter (Signed)
Yes that's correct.  She needs f/up in 2 mo or so

## 2018-11-07 NOTE — Progress Notes (Signed)
Per Dr. Raliegh Scarlet okay to refill at pervious dose. Patient notified. MPulliam, CMA/RT(R)

## 2018-11-07 NOTE — Telephone Encounter (Signed)
Patient called and states that she would like to restart the Wellbutrin that she was on in the past.  Last dose was for XL  156m 1 tab daily.  Please advise if this is the same dose the patient should start back on.  LOV 10/01/18. MPulliam, CMA/RT(R)

## 2018-11-07 NOTE — Telephone Encounter (Signed)
Per Dr. Raliegh Scarlet we are going to try adding an additional medication at this time. MPulliam, CMA/RT(R)

## 2018-11-08 ENCOUNTER — Other Ambulatory Visit: Payer: Self-pay

## 2018-11-08 ENCOUNTER — Telehealth: Payer: Self-pay | Admitting: Family Medicine

## 2018-11-08 NOTE — Telephone Encounter (Signed)
Patient called states she needed to ask medical assistant a question about the Rx -- forwarding note to MA to call Pt back @ [phone]  --glh

## 2018-11-08 NOTE — Telephone Encounter (Signed)
Patient called and discussed question and updated med list. MPulliam, CMA/RT(R)

## 2018-12-03 ENCOUNTER — Other Ambulatory Visit: Payer: Self-pay | Admitting: Family Medicine

## 2018-12-03 DIAGNOSIS — F909 Attention-deficit hyperactivity disorder, unspecified type: Secondary | ICD-10-CM

## 2018-12-05 ENCOUNTER — Telehealth: Payer: Self-pay | Admitting: Family Medicine

## 2018-12-05 NOTE — Telephone Encounter (Signed)
Patient called and requested I send this message before any OV occurred. She states that Dr. Jenetta Downer is aware of some weight gain that she has been having lately and is curious about starting an OTC dietary supplement called SeroVital. She is wondering about Dr. Daisy Floro thoughts on this prior to starting. Please advise and contact patient on mobile.

## 2018-12-05 NOTE — Telephone Encounter (Signed)
No I do not rec- not efficacious

## 2018-12-05 NOTE — Telephone Encounter (Signed)
Please advise your thoughts on the patient using the supplement SeroVital for weight loss. MPulliam, CMA/RT(R)

## 2018-12-06 NOTE — Telephone Encounter (Signed)
Notified - Appointment made for Monday via tele visit. MPulliam, CMA/RT(R)

## 2018-12-06 NOTE — Telephone Encounter (Signed)
Called patient and left message to call the office. MPulliam, CMA/RT(R)

## 2018-12-10 ENCOUNTER — Ambulatory Visit: Payer: Self-pay | Admitting: Family Medicine

## 2018-12-11 ENCOUNTER — Other Ambulatory Visit: Payer: Self-pay

## 2018-12-11 ENCOUNTER — Encounter: Payer: Self-pay | Admitting: Family Medicine

## 2018-12-11 ENCOUNTER — Ambulatory Visit (INDEPENDENT_AMBULATORY_CARE_PROVIDER_SITE_OTHER): Payer: BLUE CROSS/BLUE SHIELD | Admitting: Family Medicine

## 2018-12-11 VITALS — HR 65 | Ht 62.0 in | Wt 158.5 lb

## 2018-12-11 DIAGNOSIS — Z7189 Other specified counseling: Secondary | ICD-10-CM

## 2018-12-11 DIAGNOSIS — F411 Generalized anxiety disorder: Secondary | ICD-10-CM | POA: Diagnosis not present

## 2018-12-11 DIAGNOSIS — Z658 Other specified problems related to psychosocial circumstances: Secondary | ICD-10-CM | POA: Diagnosis not present

## 2018-12-11 DIAGNOSIS — E663 Overweight: Secondary | ICD-10-CM

## 2018-12-11 DIAGNOSIS — E039 Hypothyroidism, unspecified: Secondary | ICD-10-CM

## 2018-12-11 NOTE — Progress Notes (Signed)
Virtual Visit via Telephone Note for [employer], D.O- Primary Care Physician at [facility]   I connected with current patient today by telephone and verified that I am speaking with the correct person using two identifiers.   Because of federal recommendations of social distancing due to the current novel COVID-19 outbreak, an audio/video telehealth visit is felt to be most appropriate for this patient at this time.  My staff members also discussed with the patient that there may be a patient charge related to this service.   The patient expressed understanding, and agreed to proceed.    History of Present Illness:    Pt has concerns about why she can't lose wt.  She is uptight and frustrated about this today.   She is getting married in 4 months, they are buying a home in the near future as well, she also has worries about school in the general state of our society with covid-19.    Patient complains of her wt going up.     - Pt STILL IS NOT using an app and tracking!   Still is not calculating calories etc.   Pt is at 2100 calories daily currently.   She is not logging anything.   % protein- unknown;   % carbs- unknwn, etc.   No counseling/ CBT.   We discussed in the past for her to get a life coach and go for counseling to help her with all these new stressors in her life and embarking on a new life with a new person as well.  She is still not done so.   Doing some yoga along with her exercise of at least 30 minutes to 60 minutes daily.   However she has done no meditation as told her to do in past, to help her stay calmer, find some mental Peace as she is wound very tightly as we discussed today..            Wt Readings from Last 3 Encounters:  12/11/18 158 lb 8 oz (71.9 kg)  10/01/18 155 lb 11.2 oz (70.6 kg)  06/20/18 148 lb 11.2 oz (67.4 kg)    BP Readings from Last 3 Encounters:  10/01/18 116/79  06/20/18 131/90  04/18/18 119/82    Pulse Readings from  Last 3 Encounters:  12/11/18 65  10/01/18 81  06/20/18 97    BMI Readings from Last 3 Encounters:  12/11/18 28.99 kg/m  10/01/18 28.48 kg/m  06/20/18 27.20 kg/m      -Vitals obtained; Medications, allergies reconciled;  personal medical, social, Sx etc. etc. histories were updated by Lanier Prude the medical assistant today and are reflected in below chart   Patient Care Team    Relationship Specialty Notifications Start End  Mellody Dance, DO PCP - General Family Medicine  04/18/18   Lady Gary, [facility]    04/18/18      Patient Active Problem List   Diagnosis Date Noted  . Counseling on health promotion and disease prevention 12/11/2018  . Feeling uptight 12/11/2018  . Excessive sweating 06/20/2018  . Loss of hair 06/20/2018  . Thin nails 06/20/2018  . GERD (gastroesophageal reflux disease) 04/18/2018  . Hypothyroidism 04/18/2018  . Generalized arthritis 04/18/2018  . Environmental and seasonal allergies 04/18/2018  . Transient gluten sensitivity 04/18/2018  . Other fatigue 04/18/2018  . Overweight (BMI 25.0-29.9) 04/18/2018  . GAD (generalized anxiety disorder) 01/10/2018  . Preventative health care 01/12/2016  . Rash  and nonspecific skin eruption 01/12/2016  . ADHD (attention deficit hyperactivity disorder) 08/11/2015     Current Meds  Medication Sig  . amphetamine-dextroamphetamine (ADDERALL XR) 10 MG 24 hr capsule 1 tab 7 AM and 1 tab around 1 PM in afternoon  . Biotin w/ Vitamins C & E (HAIR/SKIN/NAILS PO) Take 1 tablet by mouth daily.  Marland Kitchen buPROPion (WELLBUTRIN XL) 150 MG 24 hr tablet TAKE 1 TABLET BY MOUTH EVERY DAY IN THE MORNING  . cetirizine (ZYRTEC) 10 MG tablet Take 10 mg by mouth daily as needed for allergies.  Marland Kitchen levothyroxine (SYNTHROID, LEVOTHROID) 50 MCG tablet Take 1 tablet (50 mcg total) by mouth daily.  . meloxicam (MOBIC) 15 MG tablet Take 1 tablet (15 mg total) by mouth daily.  . Multiple Vitamins-Calcium (ONE-A-DAY  WOMENS FORMULA PO) Take 1 tablet by mouth daily.  Marland Kitchen oxybutynin (DITROPAN-XL) 5 MG 24 hr tablet Take 5 mg by mouth at bedtime.     Allergies:  No Known Allergies   ROS:  See above HPI for pertinent positives and negatives   Objective:   Pulse 65, height 5' 2"  (1.575 m), weight 158 lb 8 oz (71.9 kg). (if some vitals are omitted, this means that patient was UNABLE to obtain them even though asked to get them prior to OV today) General: sounds in no acute distress.  Skin: Pt confirms warm and dry  extremities and pink fingertips Respiratory: speaking in full sentences, no conversational dyspnea Psych: A and O *3, appears insight good, mood- full      Impression and Recommendations:      ICD-10-CM   1. Overweight (BMI 25.0-29.9) E66.3   2. Counseling on health promotion and disease prevention Z71.89   3. GAD (generalized anxiety disorder) F41.1   4. Feeling uptight Z65.8   5. Hypothyroidism, unspecified type E03.9     -Long discussion with patient regarding her lack of taking the proper steps in order to achieve her goals.  I explained since she is always so uptight, this will definitely inhibit her from losing weight.  I also explained that she is not tracking hence we do not know what percent carbs, percent protein or calories she is taking in and it is near impossible for me to advise her with out this information.  Highly recommend she track using lose it or my fitness pal every single day and weigh her foods/measure etc. or, she can join weight watchers with a very strict schedule diet and track online through that. -Recommended patient get a counselor for CBT/needs a life coach to help her with her multiple stressors in her life right now.  Told patient to call her insurance and find out who is in her network and that she can just call herself for an appointment.  I told her there is no need for her to let us know and for [place] to call for her as she would not get in any sooner.  -Reassured patient that all of her labs including thyroid levels, B12, vitamin D, A1c etc. etc. are all within normal limits and there is not a physical reason for her fatigue other than the excessive stress she is under and she is poorly managing-by not taking the proper steps I have advised several times prior for her to take to better manage it. -Also recommend patient do more meditation and activities to help her with self reflection and help her be in the moment and relax.  Explained that doing pod cast for being  more at peace and helping her grow in that manner would be very beneficial. -Also patient will call her insurance and find out about if they pay for her seeing a dietitian or not.  If they do pay for this, patient was told to call us and let us know so we can put in that referral for her. -Told patient she will need to follow-up for her routine every 3 month visits for her ADD/ADHD and otherwise needs to be seen in early October for a complete physical with fasting blood work.   As part of my medical decision making, I reviewed the following data within the [place] History obtained from pt/family, CMA notes reviewed and incorporated, Labs reviewed, Radiograph/ tests reviewed if applicable and OV notes from prior OV's with me, as well as other specialists he has seen since seeing me last, were all reviewed and used in my medical decision making process today. Additionally, discussion had with patient regarding txmnt plan, their biases about that plan etc were used in my medical decision making today.  I discussed the assessment and treatment plan with the patient. The patient was provided an opportunity to ask questions and all were answered.  The patient agreed with the plan and demonstrated an understanding of the instructions.   No barriers to understanding were identified.  Red flag symptoms and signs discussed in detail.  Patient expressed understanding regarding what  to do in case of emergency\urgent symptoms   The patient was advised to call back or seek an in-person evaluation if the symptoms worsen or if the condition fails to improve as anticipated.   Return for ADD/ ADHD recheck every 36moas instructed in addition to these other visits; CPE early Oct w FBW.    **Gross side effects, risk and benefits, and alternatives of medications and treatment plan in general discussed with patient.  Patient is aware that all medications have potential side effects and we are unable to predict every side effect or drug-drug interaction that may occur.   Patient was strongly encouraged to call with any questions or concerns they may have concerns.     I provided 18+  minutes of non-face-to-face time during this encounter.   DMellody Dance DO

## 2018-12-24 ENCOUNTER — Ambulatory Visit: Payer: Self-pay | Admitting: Family Medicine

## 2019-01-01 ENCOUNTER — Ambulatory Visit: Payer: Self-pay | Admitting: Family Medicine

## 2019-01-15 ENCOUNTER — Encounter: Payer: Self-pay | Admitting: Family Medicine

## 2019-01-15 ENCOUNTER — Ambulatory Visit (INDEPENDENT_AMBULATORY_CARE_PROVIDER_SITE_OTHER): Payer: BLUE CROSS/BLUE SHIELD | Admitting: Family Medicine

## 2019-01-15 ENCOUNTER — Other Ambulatory Visit: Payer: Self-pay

## 2019-01-15 VITALS — Ht 62.0 in | Wt 150.0 lb

## 2019-01-15 DIAGNOSIS — J3089 Other allergic rhinitis: Secondary | ICD-10-CM | POA: Diagnosis not present

## 2019-01-15 DIAGNOSIS — J320 Chronic maxillary sinusitis: Secondary | ICD-10-CM

## 2019-01-15 DIAGNOSIS — H6980 Other specified disorders of Eustachian tube, unspecified ear: Secondary | ICD-10-CM

## 2019-01-15 DIAGNOSIS — E663 Overweight: Secondary | ICD-10-CM | POA: Diagnosis not present

## 2019-01-15 MED ORDER — AMOXICILLIN-POT CLAVULANATE 875-125 MG PO TABS: 1.0000 | ORAL_TABLET | Freq: Two times a day (BID) | ORAL | 0 refills | Status: AC

## 2019-01-15 MED ORDER — PREDNISONE 20 MG PO TABS: ORAL_TABLET | ORAL | 0 refills | Status: DC

## 2019-01-15 NOTE — Progress Notes (Signed)
Virtual / live video office visit note for [employer], D.O- Primary Care Physician at [facility]   I connected with current patient today and beyond visually recognizing the correct individual, I verified that I am speaking with the correct person using two identifiers.  . Location of the patient: Home . Location of the provider: Office Only the patient (+/- their family members at pt's discretion) and myself were participating in the encounter    - This visit type was conducted due to national recommendations for restrictions regarding the COVID-19 Pandemic (e.g. social distancing) in an effort to limit this patient's exposure and mitigate transmission in our community.  This format is felt to be most appropriate for this patient at this time.   - The patient did have access to video technology today   - No physical exam could be performed with this format, beyond that communicated to [place] by the patient/ family members as noted.   - Additionally my office staff/ schedulers discussed with the patient that there may be a monetary charge related to this service, depending on patient's medical insurance.   The patient expressed understanding, and agreed to proceed.      History of Present Illness:  Around 5 d ago- sneezing, nose stuffy- worse at night- couldn't breath, no temps at all- 98.7.  No one sided face pain or ear pain.  Cracking ears, feel like in tunnel.  Under b/l eyes- painful.   Rinses twice daily and flonase,  Taking zyrtec as well, and mucinex dm as well.   Patient also mentions that she has been using the lose it app for 2 weeks now and has lost 5-1/2 pounds.  She apologizes and states she should have listen to me earlier and tracked earlier.  However, she is very happy with this and I warned her about prednisones effect on appetite    Impression and Recommendations:    1. Chronic sinusitis of both maxillary sinuses   2. Environmental and seasonal allergies   3.  Dysfunction of Eustachian tube, unspecified laterality   4. Overweight (BMI 25.0-29.9)     -Patient states that she is needed prednisone in the past and tolerates it well.  She understands potential risk of appetite and possible weight gain. -Proud patient and congratulated since she is losing weight using the lose it app.  Strongly encouraged to be more anal and specific with her tracking while on prednisone as this can increase appetite. -Gave antibiotics only to use if she develops one-sided face pain with fevers indicating this may have turned into a bacterial sinusitis.  - As part of my medical decision making, I reviewed the following data within the [place] History obtained from pt /family, CMA notes reviewed and incorporated if applicable, Labs reviewed, Radiograph/ tests reviewed if applicable and OV notes from prior OV's with me, as well as other specialists she/he has seen since seeing me last, were all reviewed and used in my medical decision making process today.   - Additionally, discussion had with patient regarding txmnt plan, their biases about that plan etc were used in my medical decision making today.   - The patient agreed with the plan and demonstrated an understanding of the instructions.   No barriers to understanding were identified.   - Red flag symptoms and signs discussed in detail.  Patient expressed understanding regarding what to do in case of emergency\ urgent symptoms.  The patient was advised to  call back or seek an in-person evaluation if the symptoms worsen or if the condition fails to improve as anticipated.   Return if symptoms worsen or fail to improve, for F-up of current med issues as previously d/c pt.    No orders of the defined types were placed in this encounter.   Meds ordered this encounter  Medications  . amoxicillin-clavulanate (AUGMENTIN) 875-125 MG tablet    Sig: Take 1 tablet by mouth 2 (two) times daily for 10 days.     Dispense:  20 tablet    Refill:  0    May or may not pick up if she does not meet criteria for ABX  . predniSONE (DELTASONE) 20 MG tablet    Sig: Take 3 pills a day for 2 days, 2 pills a day for 2 days, 1 pill a day for 2 days then one half pill a day for 2 days then off    Dispense:  14 tablet    Refill:  0    There are no discontinued medications.    I provided 14 minutes of non-face-to-face time during this encounter,with over 50% of the time in direct counseling on patients medical conditions/ medical concerns.  Additional time was spent with charting and coordination of care after the actual visit commenced.   Note:  This note was prepared with assistance of Dragon voice recognition software. Occasional wrong-word or sound-a-like substitutions may have occurred due to the inherent limitations of voice recognition software.  Mellody Dance, DO     Patient Care Team    Relationship Specialty Notifications Start End  Mellody Dance, DO PCP - General Family Medicine  04/18/18   Lady Gary, [facility]    04/18/18     -Vitals obtained; medications/ allergies reconciled;  personal medical, social, Sx etc.histories were updated by CMA, reviewed by me and are reflected in chart  Patient Active Problem List   Diagnosis Date Noted  . Counseling on health promotion and disease prevention 12/11/2018  . Feeling uptight 12/11/2018  . Excessive sweating 06/20/2018  . Loss of hair 06/20/2018  . Thin nails 06/20/2018  . GERD (gastroesophageal reflux disease) 04/18/2018  . Hypothyroidism 04/18/2018  . Generalized arthritis 04/18/2018  . Environmental and seasonal allergies 04/18/2018  . Transient gluten sensitivity 04/18/2018  . Other fatigue 04/18/2018  . Overweight (BMI 25.0-29.9) 04/18/2018  . GAD (generalized anxiety disorder) 01/10/2018  . Preventative health care 01/12/2016  . Rash and nonspecific skin eruption 01/12/2016  . ADHD (attention deficit hyperactivity  disorder) 08/11/2015     Current Meds  Medication Sig  . amphetamine-dextroamphetamine (ADDERALL XR) 10 MG 24 hr capsule 1 tab 7 AM and 1 tab around 1 PM in afternoon  . Biotin w/ Vitamins C & E (HAIR/SKIN/NAILS PO) Take 1 tablet by mouth daily.  Marland Kitchen buPROPion (WELLBUTRIN XL) 150 MG 24 hr tablet TAKE 1 TABLET BY MOUTH EVERY DAY IN THE MORNING  . cetirizine (ZYRTEC) 10 MG tablet Take 10 mg by mouth daily as needed for allergies.  Marland Kitchen dextromethorphan-guaiFENesin (MUCINEX DM) 30-600 MG 12hr tablet Take 1 tablet by mouth 2 (two) times daily.  Marland Kitchen levothyroxine (SYNTHROID, LEVOTHROID) 50 MCG tablet Take 1 tablet (50 mcg total) by mouth daily.  . meloxicam (MOBIC) 15 MG tablet Take 1 tablet (15 mg total) by mouth daily.  . Multiple Vitamins-Calcium (ONE-A-DAY WOMENS FORMULA PO) Take 1 tablet by mouth daily.  Marland Kitchen oxybutynin (DITROPAN-XL) 5 MG 24 hr tablet Take 5 mg by mouth at bedtime.  No Known Allergies   ROS:  See above HPI for pertinent positives and negatives   Objective:   Height 5' 2"  (1.575 m), weight 150 lb (68 kg).  (if some vitals are omitted, this means that patient was UNABLE to obtain them even though they were asked to get them prior to OV today.  They were asked to call us at their earliest convenience with these once obtained.)  General: A & O * 3; visually in no acute distress; in usual state of health.  Skin: Visible skin appears normal and pt's usual skin color HEENT:  EOMI, head is normocephalic and atraumatic.  Sclera are anicteric. Neck has a good range of motion.  Lips are noncyanotic Chest: normal chest excursion and movement Respiratory: speaking in full sentences, no conversational dyspnea; no use of accessory muscles Psych: insight good, mood- appears full

## 2019-03-14 ENCOUNTER — Other Ambulatory Visit: Payer: Self-pay | Admitting: Family Medicine

## 2019-03-14 DIAGNOSIS — R5383 Other fatigue: Secondary | ICD-10-CM

## 2019-03-14 DIAGNOSIS — E039 Hypothyroidism, unspecified: Secondary | ICD-10-CM

## 2019-04-10 DIAGNOSIS — Z01419 Encounter for gynecological examination (general) (routine) without abnormal findings: Secondary | ICD-10-CM | POA: Diagnosis not present

## 2019-04-10 DIAGNOSIS — Z6827 Body mass index (BMI) 27.0-27.9, adult: Secondary | ICD-10-CM | POA: Diagnosis not present

## 2019-05-14 ENCOUNTER — Telehealth: Payer: Self-pay

## 2019-05-14 ENCOUNTER — Other Ambulatory Visit: Payer: Self-pay | Admitting: Family Medicine

## 2019-05-14 DIAGNOSIS — F909 Attention-deficit hyperactivity disorder, unspecified type: Secondary | ICD-10-CM | POA: Insufficient documentation

## 2019-05-14 DIAGNOSIS — F908 Attention-deficit hyperactivity disorder, other type: Secondary | ICD-10-CM

## 2019-05-14 DIAGNOSIS — F411 Generalized anxiety disorder: Secondary | ICD-10-CM

## 2019-05-14 DIAGNOSIS — R6882 Decreased libido: Secondary | ICD-10-CM | POA: Diagnosis not present

## 2019-05-14 NOTE — Telephone Encounter (Signed)
Please call pt to schedule telemedicine visit.  RX for buproprion refused until she sees Dr. Raliegh Scarlet.  Charyl Bigger, CMA

## 2019-05-31 ENCOUNTER — Telehealth: Payer: Self-pay | Admitting: Family Medicine

## 2019-05-31 NOTE — Telephone Encounter (Signed)
Called pt to set up appt, pt @ work will call us back later.  (Please call pt to schedule telemedicine visit.  RX for buproprion refused until she sees Dr. Raliegh Scarlet.  Charyl Bigger, CMA)  --Juluis Rainier to medical staff.  glh

## 2019-06-02 DIAGNOSIS — Z20828 Contact with and (suspected) exposure to other viral communicable diseases: Secondary | ICD-10-CM | POA: Diagnosis not present

## 2019-07-09 ENCOUNTER — Ambulatory Visit (INDEPENDENT_AMBULATORY_CARE_PROVIDER_SITE_OTHER): Payer: BC Managed Care – PPO | Admitting: Family Medicine

## 2019-07-09 ENCOUNTER — Encounter: Payer: Self-pay | Admitting: Family Medicine

## 2019-07-09 ENCOUNTER — Other Ambulatory Visit: Payer: Self-pay

## 2019-07-09 DIAGNOSIS — F411 Generalized anxiety disorder: Secondary | ICD-10-CM

## 2019-07-09 DIAGNOSIS — R5383 Other fatigue: Secondary | ICD-10-CM | POA: Diagnosis not present

## 2019-07-09 DIAGNOSIS — E039 Hypothyroidism, unspecified: Secondary | ICD-10-CM | POA: Diagnosis not present

## 2019-07-09 DIAGNOSIS — F908 Attention-deficit hyperactivity disorder, other type: Secondary | ICD-10-CM

## 2019-07-09 MED ORDER — LEVOTHYROXINE SODIUM 50 MCG PO TABS: 50.0000 ug | ORAL_TABLET | Freq: Every day | ORAL | 1 refills | Status: DC

## 2019-07-09 MED ORDER — AMPHETAMINE-DEXTROAMPHET ER 10 MG PO CP24: 10.0000 mg | ORAL_CAPSULE | ORAL | 0 refills | Status: DC

## 2019-07-09 MED ORDER — BUPROPION HCL ER (XL) 150 MG PO TB24: ORAL_TABLET | ORAL | 1 refills | Status: DC

## 2019-07-09 NOTE — Progress Notes (Addendum)
Virtual / live video office visit note for [employer], D.O- Primary Care Physician at [facility]   I connected with current patient today and beyond visually recognizing the correct individual, I verified that I am speaking with the correct person using two identifiers.  . Location of the patient: Home . Location of the provider: Office Only the patient (+/- their family members at pt's discretion) and myself were participating in the encounter    - This visit type was conducted due to national recommendations for restrictions regarding the COVID-19 Pandemic (e.g. social distancing) in an effort to limit this patient's exposure and mitigate transmission in our community.  This format is felt to be most appropriate for this patient at this time.   - The patient did have access to video technology today  - No physical exam could be performed with this format, beyond that communicated to [place] by the patient/ family members as noted.   - Additionally my office staff/ schedulers discussed with the patient that there may be a monetary charge related to this service, depending on patient's medical insurance.   The patient expressed understanding, and agreed to proceed.      History of Present Illness:  I, [name], am serving as scribe for Dr. Mellody Dance.  - Patient last seen for chronic f/up 4/14.  Told patient in April of 2020 to follow up every 3 months for her ADHD management.meds.  Otherwise return early October for CPE and fasting blood work. - Patient was lost to follow-up.   Recently had her honeymoon in the second week of October.  Notes it was very enjoyable and confirms her wedding was all she dreamed it would be.  - ADHD Management Only taking Adderall once daily "because it was keeping me up at night."  Says reducing her dose helped.  She takes the Adderall during the week; takes drug holidays on the weekends.  Notes also took a drug holiday during her  honeymoon in [place].  Notes "doing okay during the day."  Feels the Wellbutrin is helping "and that might be why I don't need as much Adderall" or "why it was too much at night and I wasn't able to get to sleep good."  - Mood Management Feels the Wellbutrin 150 is at a nice dose.  Working well, no complaints  - Hypothyroidism Continues synthroid daily.    Depression screen [facility] 2/9 07/09/2019 01/15/2019 12/11/2018 10/01/2018 06/20/2018  Decreased Interest 0 0 0 0 0  Down, Depressed, Hopeless 0 0 0 0 0  PHQ - 2 Score 0 0 0 0 0  Altered sleeping 0 0 0 0 2  Tired, decreased energy 0 0 2 1 1   Change in appetite 0 0 0 0 1  Feeling bad or failure about yourself  0 0 0 0 0  Trouble concentrating 3 0 2 3 3   Moving slowly or fidgety/restless 0 0 0 1 3  Suicidal thoughts 0 0 0 0 0  PHQ-9 Score 3 0 4 5 10   Difficult doing work/chores Not difficult at all Not difficult at all Somewhat difficult Somewhat difficult -    GAD 7 : Generalized Anxiety Score 01/15/2019 12/11/2018 04/18/2018 01/10/2018  Nervous, Anxious, on Edge 0 0 1 2  Control/stop worrying 0 0 0 0  Worry too much - different things 0 0 0 0  Trouble relaxing 0 0 2 3  Restless 0 3 3 3   Easily annoyed or irritable 0  1 2 3   Afraid - awful might happen 0 0 0 0  Total GAD 7 Score 0 4 8 11   Anxiety Difficulty Not difficult at all Somewhat difficult Very difficult Somewhat difficult     Impression and Recommendations:    1. Attention deficit hyperactivity disorder (ADHD), other type   2. GAD (generalized anxiety disorder)   3. Hypothyroidism, unspecified type   4. Other fatigue     - Patient last seen for chronic f/up 4/14.  Told patient in April of 2020 to follow up every 3 months for her ADHD management.meds.  Otherwise return early October for CPE and fasting blood work. - Patient was lost to follow-up.  - Reviewed policy of controlled substance monitoring and emphasized need for patient to return as advised.  ADHD - Stable on  Adderall management, one tablet daily. - No changes made to treatment plan today. - Continue management as established.  See med list.  - Will continue to monitor.  GAD - Stable at this time on current management. - No changes made to treatment plan today. - Continue Wellbutrin as prescribed.  See med list. - Will continue to monitor.  Hypothyroidism - Stable at this time on synthroid management. - Patient asymptomatic at this time. - Continue treatment plan as established.  See med list. - Will continue to monitor and re-check as recommended.  Lifestyle & Preventative Health Maintenance - Advised patient to continue working toward exercising to improve overall mental, physical, and emotional health.    - Reviewed the "spokes of the wheel" of mood and health management.  Stressed the importance of ongoing prudent habits, including regular exercise, appropriate sleep hygiene, healthful dietary habits, and prayer/meditation to relax.  - Encouraged patient to engage in daily physical activity, especially a formal exercise routine.  Recommended that the patient eventually strive for at least 150 minutes of moderate cardiovascular activity per week according to guidelines established by the [facility].   - Healthy dietary habits encouraged, including low-carb, and high amounts of lean protein in diet.   - Patient should also consume adequate amounts of water.  - Health counseling performed.  All questions answered.  Recommendations - Advised patient to return in three months for CPE and full fasting blood work.    - As part of my medical decision making, I reviewed the following data within the [place] History obtained from pt /family, CMA notes reviewed and incorporated if applicable, Labs reviewed, Radiograph/ tests reviewed if applicable and OV notes from prior OV's with me, as well as other specialists she/he has seen since seeing me last, were all reviewed and used in  my medical decision making process today.   - Additionally, discussion had with patient regarding txmnt plan, their biases about that plan etc were used in my medical decision making today.   - The patient agreed with the plan and demonstrated an understanding of the instructions.   No barriers to understanding were identified.   - Red flag symptoms and signs discussed in detail.  Patient expressed understanding regarding what to do in case of emergency\ urgent symptoms.  The patient was advised to call back or seek an in-person evaluation if the symptoms worsen or if the condition fails to improve as anticipated.   Return for CPE and full fasting blood work in 3 months.    No orders of the defined types were placed in this encounter.   Meds ordered this encounter  Medications  . amphetamine-dextroamphetamine (ADDERALL  XR) 10 MG 24 hr capsule    Sig: Take 1 capsule (10 mg total) by mouth every morning.    Dispense:  90 capsule    Refill:  0  . buPROPion (WELLBUTRIN XL) 150 MG 24 hr tablet    Sig: TAKE 1 TABLET BY MOUTH EVERY DAY IN THE MORNING    Dispense:  90 tablet    Refill:  1  . levothyroxine (SYNTHROID) 50 MCG tablet    Sig: Take 1 tablet (50 mcg total) by mouth daily.    Dispense:  90 tablet    Refill:  1    Medications Discontinued During This Encounter  Medication Reason  . predniSONE (DELTASONE) 20 MG tablet Error  . amphetamine-dextroamphetamine (ADDERALL XR) 10 MG 24 hr capsule Reorder  . buPROPion (WELLBUTRIN XL) 150 MG 24 hr tablet Reorder  . levothyroxine (SYNTHROID) 50 MCG tablet Reorder      Note:  This note was prepared with assistance of Dragon voice recognition software. Occasional wrong-word or sound-a-like substitutions may have occurred due to the inherent limitations of voice recognition software.  This document serves as a record of services personally performed by Mellody Dance, DO. It was created on her behalf by [name], a trained  medical scribe. The creation of this record is based on the scribe's personal observations and the provider's statements to them.   This case required medical decision making of at least moderate complexity. The above documentation has been reviewed to be accurate and was completed by Marjory Sneddon, D.O.      Patient Care Team    Relationship Specialty Notifications Start End  Mellody Dance, DO PCP - General Family Medicine  04/18/18   Lady Gary, [facility]    04/18/18     -Vitals obtained; medications/ allergies reconciled;  personal medical, social, Sx etc.histories were updated by CMA, reviewed by me and are reflected in chart  Patient Active Problem List   Diagnosis Date Noted  . Attention deficit hyperactivity disorder 05/14/2019  . Counseling on health promotion and disease prevention 12/11/2018  . Feeling uptight 12/11/2018  . Excessive sweating 06/20/2018  . Loss of hair 06/20/2018  . Thin nails 06/20/2018  . GERD (gastroesophageal reflux disease) 04/18/2018  . Hypothyroidism 04/18/2018  . Generalized arthritis 04/18/2018  . Environmental and seasonal allergies 04/18/2018  . Transient gluten sensitivity 04/18/2018  . Other fatigue 04/18/2018  . Overweight (BMI 25.0-29.9) 04/18/2018  . GAD (generalized anxiety disorder) 01/10/2018  . Preventative health care 01/12/2016  . Rash and nonspecific skin eruption 01/12/2016  . ADHD (attention deficit hyperactivity disorder) 08/11/2015     Current Meds  Medication Sig  . amphetamine-dextroamphetamine (ADDERALL XR) 10 MG 24 hr capsule Take 1 capsule (10 mg total) by mouth every morning.  . Biotin w/ Vitamins C & E (HAIR/SKIN/NAILS PO) Take 1 tablet by mouth daily.  Marland Kitchen buPROPion (WELLBUTRIN XL) 150 MG 24 hr tablet TAKE 1 TABLET BY MOUTH EVERY DAY IN THE MORNING  . cetirizine (ZYRTEC) 10 MG tablet Take 10 mg by mouth daily as needed for allergies.  Marland Kitchen levothyroxine (SYNTHROID) 50 MCG tablet Take 1 tablet (50  mcg total) by mouth daily.  . Multiple Vitamins-Calcium (ONE-A-DAY WOMENS FORMULA PO) Take 1 tablet by mouth daily.  . [DISCONTINUED] amphetamine-dextroamphetamine (ADDERALL XR) 10 MG 24 hr capsule 1 tab 7 AM and 1 tab around 1 PM in afternoon  . [DISCONTINUED] buPROPion (WELLBUTRIN XL) 150 MG 24 hr tablet TAKE 1 TABLET BY MOUTH EVERY DAY  IN THE MORNING  . [DISCONTINUED] levothyroxine (SYNTHROID) 50 MCG tablet TAKE 1 TABLET BY MOUTH EVERY DAY     No Known Allergies   ROS:  See above HPI for pertinent positives and negatives   Objective:   There were no vitals taken for this visit.  (if some vitals are omitted, this means that patient was UNABLE to obtain them even though they were asked to get them prior to OV today.  They were asked to call us at their earliest convenience with these once obtained.)  General: A & O * 3; visually in no acute distress; in usual state of health.  Skin: Visible skin appears normal and pt's usual skin color HEENT:  EOMI, head is normocephalic and atraumatic.  Sclera are anicteric. Neck has a good range of motion.  Lips are noncyanotic Chest: normal chest excursion and movement Respiratory: speaking in full sentences, no conversational dyspnea; no use of accessory muscles Psych: insight good, mood- appears full

## 2019-07-16 ENCOUNTER — Ambulatory Visit: Payer: BC Managed Care – PPO

## 2019-07-23 ENCOUNTER — Ambulatory Visit: Payer: BC Managed Care – PPO

## 2019-08-15 DIAGNOSIS — R102 Pelvic and perineal pain: Secondary | ICD-10-CM | POA: Diagnosis not present

## 2019-08-15 DIAGNOSIS — N760 Acute vaginitis: Secondary | ICD-10-CM | POA: Diagnosis not present

## 2019-09-17 DIAGNOSIS — R102 Pelvic and perineal pain: Secondary | ICD-10-CM | POA: Diagnosis not present

## 2019-10-01 DIAGNOSIS — A084 Viral intestinal infection, unspecified: Secondary | ICD-10-CM | POA: Diagnosis not present

## 2019-10-01 DIAGNOSIS — Z20822 Contact with and (suspected) exposure to covid-19: Secondary | ICD-10-CM | POA: Diagnosis not present

## 2019-10-25 ENCOUNTER — Encounter: Payer: Self-pay | Admitting: Family Medicine

## 2019-10-25 DIAGNOSIS — E663 Overweight: Secondary | ICD-10-CM

## 2019-10-28 NOTE — Telephone Encounter (Signed)
Referral to dietitian ordered. AS, CMA

## 2019-11-12 ENCOUNTER — Ambulatory Visit: Payer: BLUE CROSS/BLUE SHIELD | Admitting: Podiatry

## 2019-11-12 ENCOUNTER — Ambulatory Visit: Payer: BC Managed Care – PPO | Admitting: Family Medicine

## 2019-11-13 ENCOUNTER — Ambulatory Visit (INDEPENDENT_AMBULATORY_CARE_PROVIDER_SITE_OTHER): Payer: BC Managed Care – PPO | Admitting: Family Medicine

## 2019-11-13 ENCOUNTER — Encounter: Payer: Self-pay | Admitting: Family Medicine

## 2019-11-13 ENCOUNTER — Other Ambulatory Visit: Payer: Self-pay

## 2019-11-13 VITALS — Ht 61.0 in | Wt 160.0 lb

## 2019-11-13 DIAGNOSIS — F411 Generalized anxiety disorder: Secondary | ICD-10-CM

## 2019-11-13 DIAGNOSIS — F908 Attention-deficit hyperactivity disorder, other type: Secondary | ICD-10-CM

## 2019-11-13 DIAGNOSIS — R61 Generalized hyperhidrosis: Secondary | ICD-10-CM

## 2019-11-13 DIAGNOSIS — E039 Hypothyroidism, unspecified: Secondary | ICD-10-CM

## 2019-11-13 DIAGNOSIS — R5383 Other fatigue: Secondary | ICD-10-CM

## 2019-11-13 MED ORDER — BUPROPION HCL ER (XL) 150 MG PO TB24: ORAL_TABLET | ORAL | 1 refills | Status: DC

## 2019-11-13 MED ORDER — OXYBUTYNIN CHLORIDE ER 5 MG PO TB24: 5.0000 mg | ORAL_TABLET | Freq: Every day | ORAL | 1 refills | Status: DC

## 2019-11-13 NOTE — Progress Notes (Signed)
Telehealth office visit note for Amber Murillo, D.O- at Primary Care at [facility]   I connected with current patient today and verified that I am speaking with the correct person   . Location of the patient: Home . Location of the provider: Office - This visit type was conducted due to national recommendations for restrictions regarding the COVID-19 Pandemic (e.g. social distancing) in an effort to limit this patient's exposure and mitigate transmission in our community.    - No physical exam could be performed with this format, beyond that communicated to [place] by the patient/ family members as noted.   - Additionally my office staff/ schedulers were to discuss with the patient that there may be a monetary charge related to this service, depending on their medical insurance.  My understanding is that patient understood and consented to proceed.     _________________________________________________________________________________   History of Present Illness:  I, Amber Murillo, am serving as Education administrator for [employer].    Says she's been doing great overall.  - Mood and ADHD Management Lately, she tried to go without her Adderall medications; notes she decided to try this after she ran out of medication and did not have a refill.  Says "there are times that I could notice the Adderall was making me a little antsy, a little on edge," and she wanted to see if she could go without it.  She took her last Adderall about three weeks ago.  "I don't notice a difference when I'm on [the Adderall] or when I'm not on it."  Feels she's "done very well on [the Wellbutrin alone]," to the point to where when she does take the Adderall, she doesn't see any changes between adding the Adderall, and when she uses the Wellbutrin alone.  She continues exercising and meditating.  Notes "I'm not in school, so I don't have any of that stress."  Notes "the Wellbutrin seems to be doing just as well  [alone] as when I'm on the Adderall, I don't really notice a difference."  She did not run out of Wellbutrin.  "I'm still happy; there are days where I'm a little more hyper than others," but confirms she feels calmer in general.  Says "It's not a bad thing, not having the Adderall lately."  - Sweating, Managed by Dermatology Was placed on a low dose of oxybutynin by dermatology in the past to help with sweating.  Notes since starting that low dose, "I've noticed a difference with [the sweating]."  Notes the dermatologist "told me that it only helps in low doses, and if you were to use it for actual incontinence, it takes a higher dose."   - Thyroid Concerns  Patient notes that she's gained 10 lbs since last OV and does believe she's felt more symptomatic.  Confirms feeling sluggish.  Says she is napping much more and "I feel like if I don't get a nap, I won't make it through the day, which is how I felt when I first thought something was wrong, and they did the blood work for the thyroid."   GAD 7 : Generalized Anxiety Score 11/13/2019 01/15/2019 12/11/2018 04/18/2018  Nervous, Anxious, on Edge 0 0 0 1  Control/stop worrying 0 0 0 0  Worry too much - different things 0 0 0 0  Trouble relaxing 0 0 0 2  Restless 0 0 3 3  Easily annoyed or irritable 0 0 1 2  Afraid - awful might happen  0 0 0 0  Total GAD 7 Score 0 0 4 8  Anxiety Difficulty - Not difficult at all Somewhat difficult Very difficult    Depression screen [facility] 2/9 11/13/2019 07/09/2019 01/15/2019 12/11/2018 10/01/2018  Decreased Interest 0 0 0 0 0  Down, Depressed, Hopeless 0 0 0 0 0  PHQ - 2 Score 0 0 0 0 0  Altered sleeping 0 0 0 0 0  Tired, decreased energy 1 0 0 2 1  Change in appetite 0 0 0 0 0  Feeling bad or failure about yourself  0 0 0 0 0  Trouble concentrating 0 3 0 2 3  Moving slowly or fidgety/restless 0 0 0 0 1  Suicidal thoughts 0 0 0 0 0  PHQ-9 Score 1 3 0 4 5  Difficult doing work/chores Not difficult at all Not  difficult at all Not difficult at all Somewhat difficult Somewhat difficult      Impression and Recommendations:     1. Attention deficit hyperactivity disorder (ADHD), other type   2. GAD (generalized anxiety disorder)   3. Sweating increase   4. Hypothyroidism, unspecified type   5. Excessive sweating   6. Other fatigue      ADHD, GAD - Stable at this time. - Per patient, discontinued Adderall at home after prescription ran out.  - Per patient, symptoms stable, well-controlled on Wellbutrin alone. - Six months supply of Wellbutrin provided today. - Continue treatment plan as established.  See med list.  - In addition to prescription intervention, reviewed the "spokes of the wheel" of mood and health management.  Stressed the importance of ongoing prudent habits, including regular exercise, appropriate sleep hygiene, healthful dietary habits, and prayer/meditation to relax.  - Advised patient to continue with regular exercise and meditation to manage her mood.  - Will continue to monitor.   Sweating Increase, Excessive Sweating - Per patient, began treatment with low-dose oxybutynin given to her through dermatology for her sweating. - Symptoms stable, better controlled on treatment plan. - Continue treatment plan as established.  See med list. - Refill provided today.  - Will continue to monitor.   Hypothyroidism, Other Chronic Fatigue -stable when last checked levels - cont med at current dose - will check levels near future   Recommendations - Return every 5-6 months for chronic care and mood management, and once yearly for a CPE.   - As part of my medical decision making, I reviewed the following data within the electronic MEDICAL RECORD NUMBER History obtained from pt /family, CMA notes reviewed and incorporated if applicable, Labs reviewed, Radiograph/ tests reviewed if applicable and OV notes from prior OV's with me, as well as other specialists she/he has seen  since seeing me last, were all reviewed and used in my medical decision making process today.    - Additionally, when appropriate, discussion had with patient regarding our treatment plan, and their biases/concerns about that plan were used in my medical decision making today.    - The patient agreed with the plan and demonstrated an understanding of the instructions.   No barriers to understanding were identified.     - The patient was advised to call back or seek an in-person evaluation if the symptoms worsen or if the condition fails to improve as anticipated.   Return for f/up every 5-6 months for mood management, and once yearly for CPE.    Orders Placed This Encounter  Procedures  . CBC  . Comprehensive metabolic panel  .  TSH  . T4, free  . Hemoglobin A1c  . Lipid panel  . VITAMIN D 25 Hydroxy (Vit-D Deficiency, Fractures)    Meds ordered this encounter  Medications  . buPROPion (WELLBUTRIN XL) 150 MG 24 hr tablet    Sig: TAKE 1 TABLET BY MOUTH EVERY DAY IN THE MORNING    Dispense:  90 tablet    Refill:  1  . oxybutynin (DITROPAN-XL) 5 MG 24 hr tablet    Sig: Take 1 tablet (5 mg total) by mouth at bedtime.    Dispense:  90 tablet    Refill:  1    Medications Discontinued During This Encounter  Medication Reason  . buPROPion (WELLBUTRIN XL) 150 MG 24 hr tablet Reorder  . oxybutynin (DITROPAN-XL) 5 MG 24 hr tablet Reorder  . amphetamine-dextroamphetamine (ADDERALL XR) 10 MG 24 hr capsule   . amphetamine-dextroamphetamine (ADDERALL XR) 10 MG 24 hr capsule        Time spent on visit including pre-visit chart review and post-visit care was 18+ minutes.   Note:  This note was prepared with assistance of Dragon voice recognition software. Occasional wrong-word or sound-a-like substitutions may have occurred due to the inherent limitations of voice recognition software.  The [place] was signed into law in 2016 which includes the topic of electronic  health records.  This provides immediate access to information in MyChart.  This includes consultation notes, operative notes, office notes, lab results and pathology reports.  If you have any questions about what you read please let us know at your next visit or call us at the office.  We are right here with you.  This document serves as a record of services personally performed by Amber Dance, DO. It was created on her behalf by Amber Murillo, a trained medical scribe. The creation of this record is based on the scribe's personal observations and the provider's statements to them.   This case required medical decision making of at least moderate complexity. The above documentation from Amber Murillo, medical scribe, has been reviewed by Marjory Sneddon, D.O.     __________________________________________________________________________________     Patient Care Team    Relationship Specialty Notifications Start End  Amber Dance, DO PCP - General Family Medicine  04/18/18   Lady Gary, [facility]    04/18/18      -Vitals obtained; medications/ allergies reconciled;  personal medical, social, Sx etc.histories were updated by CMA, reviewed by me and are reflected in chart   Patient Active Problem List   Diagnosis Date Noted  . Attention deficit hyperactivity disorder 05/14/2019  . Counseling on health promotion and disease prevention 12/11/2018  . Feeling uptight 12/11/2018  . Excessive sweating 06/20/2018  . Loss of hair 06/20/2018  . Thin nails 06/20/2018  . GERD (gastroesophageal reflux disease) 04/18/2018  . Hypothyroidism 04/18/2018  . Generalized arthritis 04/18/2018  . Environmental and seasonal allergies 04/18/2018  . Transient gluten sensitivity 04/18/2018  . Other fatigue 04/18/2018  . Overweight (BMI 25.0-29.9) 04/18/2018  . GAD (generalized anxiety disorder) 01/10/2018  . Preventative health care 01/12/2016  . Rash and nonspecific  skin eruption 01/12/2016  . ADHD (attention deficit hyperactivity disorder) 08/11/2015     Current Meds  Medication Sig  . Biotin w/ Vitamins C & E (HAIR/SKIN/NAILS PO) Take 1 tablet by mouth daily.  Marland Kitchen buPROPion (WELLBUTRIN XL) 150 MG 24 hr tablet TAKE 1 TABLET BY MOUTH EVERY DAY IN THE MORNING  . cetirizine (ZYRTEC) 10 MG  tablet Take 10 mg by mouth daily as needed for allergies.  Marland Kitchen levothyroxine (SYNTHROID) 50 MCG tablet Take 1 tablet (50 mcg total) by mouth daily.  . meloxicam (MOBIC) 15 MG tablet Take 1 tablet (15 mg total) by mouth daily.  . Multiple Vitamins-Calcium (ONE-A-DAY WOMENS FORMULA PO) Take 1 tablet by mouth daily.  Marland Kitchen oxybutynin (DITROPAN-XL) 5 MG 24 hr tablet Take 1 tablet (5 mg total) by mouth at bedtime.  . [DISCONTINUED] buPROPion (WELLBUTRIN XL) 150 MG 24 hr tablet TAKE 1 TABLET BY MOUTH EVERY DAY IN THE MORNING  . [DISCONTINUED] oxybutynin (DITROPAN-XL) 5 MG 24 hr tablet Take 5 mg by mouth at bedtime.     Allergies:  No Known Allergies   ROS:  See above HPI for pertinent positives and negatives   Objective:   Height 5' 1"  (1.549 m), weight 160 lb (72.6 kg).  (if some vitals are omitted, this means that patient was UNABLE to obtain them even though they were asked to get them prior to OV today.  They were asked to call us at their earliest convenience with these once obtained. ) General: A & O * 3; sounds in no acute distress; in usual state of health.  Skin: Pt confirms warm and dry extremities and pink fingertips HEENT: Pt confirms lips non-cyanotic Chest: Patient confirms normal chest excursion and movement Respiratory: speaking in full sentences, no conversational dyspnea; patient confirms no use of accessory muscles Psych: insight appears good, mood- appears full

## 2019-12-04 ENCOUNTER — Other Ambulatory Visit: Payer: Self-pay

## 2019-12-04 ENCOUNTER — Other Ambulatory Visit: Payer: BC Managed Care – PPO

## 2019-12-04 DIAGNOSIS — R61 Generalized hyperhidrosis: Secondary | ICD-10-CM

## 2019-12-04 DIAGNOSIS — F908 Attention-deficit hyperactivity disorder, other type: Secondary | ICD-10-CM | POA: Diagnosis not present

## 2019-12-04 DIAGNOSIS — F411 Generalized anxiety disorder: Secondary | ICD-10-CM | POA: Diagnosis not present

## 2019-12-04 DIAGNOSIS — E039 Hypothyroidism, unspecified: Secondary | ICD-10-CM | POA: Diagnosis not present

## 2019-12-04 DIAGNOSIS — R5383 Other fatigue: Secondary | ICD-10-CM

## 2019-12-05 ENCOUNTER — Telehealth: Payer: Self-pay | Admitting: Family Medicine

## 2019-12-05 LAB — CBC
Hematocrit: 43.6 % (ref 34.0–46.6)
Hemoglobin: 14.9 g/dL (ref 11.1–15.9)
MCH: 30.3 pg (ref 26.6–33.0)
MCHC: 34.2 g/dL (ref 31.5–35.7)
MCV: 89 fL (ref 79–97)
Platelets: 344 10*3/uL (ref 150–450)
RBC: 4.91 x10E6/uL (ref 3.77–5.28)
RDW: 12.3 % (ref 11.7–15.4)
WBC: 8.8 10*3/uL (ref 3.4–10.8)

## 2019-12-05 LAB — TSH: TSH: 5.7 u[IU]/mL — ABNORMAL HIGH (ref 0.450–4.500)

## 2019-12-05 LAB — COMPREHENSIVE METABOLIC PANEL
ALT: 18 IU/L (ref 0–32)
AST: 17 IU/L (ref 0–40)
Albumin/Globulin Ratio: 1.7 (ref 1.2–2.2)
Albumin: 4.2 g/dL (ref 3.9–5.0)
Alkaline Phosphatase: 106 IU/L (ref 39–117)
BUN/Creatinine Ratio: 14 (ref 9–23)
BUN: 11 mg/dL (ref 6–20)
Bilirubin Total: 0.4 mg/dL (ref 0.0–1.2)
CO2: 22 mmol/L (ref 20–29)
Calcium: 9.3 mg/dL (ref 8.7–10.2)
Chloride: 101 mmol/L (ref 96–106)
Creatinine, Ser: 0.76 mg/dL (ref 0.57–1.00)
GFR calc Af Amer: 126 mL/min/{1.73_m2} (ref >59)
GFR calc non Af Amer: 109 mL/min/{1.73_m2} (ref >59)
Globulin, Total: 2.5 g/dL (ref 1.5–4.5)
Glucose: 78 mg/dL (ref 65–99)
Potassium: 4.2 mmol/L (ref 3.5–5.2)
Sodium: 138 mmol/L (ref 134–144)
Total Protein: 6.7 g/dL (ref 6.0–8.5)

## 2019-12-05 LAB — LIPID PANEL
Chol/HDL Ratio: 5 ratio — ABNORMAL HIGH (ref 0.0–4.4)
Cholesterol, Total: 165 mg/dL (ref 100–199)
HDL: 33 mg/dL — ABNORMAL LOW (ref >39)
LDL Chol Calc (NIH): 106 mg/dL — ABNORMAL HIGH (ref 0–99)
Triglycerides: 148 mg/dL (ref 0–149)
VLDL Cholesterol Cal: 26 mg/dL (ref 5–40)

## 2019-12-05 LAB — T4, FREE: Free T4: 1.18 ng/dL (ref 0.82–1.77)

## 2019-12-05 LAB — VITAMIN D 25 HYDROXY (VIT D DEFICIENCY, FRACTURES): Vit D, 25-Hydroxy: 37.5 ng/mL (ref 30.0–100.0)

## 2019-12-05 LAB — HEMOGLOBIN A1C
Est. average glucose Bld gHb Est-mCnc: 100 mg/dL
Hgb A1c MFr Bld: 5.1 % (ref 4.8–5.6)

## 2019-12-05 NOTE — Telephone Encounter (Signed)
Patient called states although she reviewed her labs on mychart she wants to discuss some abnormalities with provider.  --forwarding request to med asst to contact patient for [phone]  --glh

## 2019-12-05 NOTE — Telephone Encounter (Signed)
If patient would like to discuss labs with provider she will need an apt to do so. Please contact patient to schedule Telehealth or In office visit. Thank you

## 2019-12-06 ENCOUNTER — Telehealth: Payer: Self-pay | Admitting: Family Medicine

## 2019-12-06 NOTE — Telephone Encounter (Signed)
Patient called back states nursing staff usually go over lab results w/ her in the past & she doesn't want to wait until her appt in May to discuss the labs results --- Advise pt all medical staff leaves @ 12 nonn on Fridays &  Expressed I am not clinical so cannot discuss lab results w/her --- Forwarding another message to med asst that patient wishes to be called back.  --glh

## 2019-12-09 ENCOUNTER — Telehealth: Payer: Self-pay | Admitting: Family Medicine

## 2019-12-09 DIAGNOSIS — R635 Abnormal weight gain: Secondary | ICD-10-CM

## 2019-12-09 DIAGNOSIS — E039 Hypothyroidism, unspecified: Secondary | ICD-10-CM

## 2019-12-09 NOTE — Telephone Encounter (Signed)
Patients labs drawn 12/04/19-have not been reviewed at this time. AS, CMA

## 2019-12-09 NOTE — Telephone Encounter (Signed)
Patient reached out to [facility] so get set up with Madelin Rear, MD (an endocrinologist in the practice). They are requesting a referral prior to scheduling. Please place referral if applicable for Dorothea Ogle to process and send over along with labs and OV notes.  Also, patient called requesting lab results from labs obtained last week.

## 2019-12-09 NOTE — Telephone Encounter (Signed)
Already addressed- see lab results for details.

## 2019-12-09 NOTE — Telephone Encounter (Signed)
Patient requesting referral to Endo due to thyroid levels. Per Dr. Raliegh Scarlet - this is fine if patient insists.   Referral being placed. AS, CMA

## 2019-12-09 NOTE — Addendum Note (Signed)
Addended by: Mickel Crow on: 12/09/2019 01:48 PM   Modules accepted: Orders

## 2019-12-11 ENCOUNTER — Ambulatory Visit: Payer: BC Managed Care – PPO | Admitting: Podiatry

## 2019-12-11 ENCOUNTER — Encounter: Payer: Self-pay | Admitting: Podiatry

## 2019-12-11 ENCOUNTER — Ambulatory Visit (INDEPENDENT_AMBULATORY_CARE_PROVIDER_SITE_OTHER): Payer: BC Managed Care – PPO

## 2019-12-11 ENCOUNTER — Other Ambulatory Visit: Payer: Self-pay

## 2019-12-11 DIAGNOSIS — M722 Plantar fascial fibromatosis: Secondary | ICD-10-CM

## 2019-12-11 MED ORDER — METHYLPREDNISOLONE 4 MG PO TBPK: ORAL_TABLET | ORAL | 0 refills | Status: DC

## 2019-12-11 MED ORDER — MELOXICAM 15 MG PO TABS: 15.0000 mg | ORAL_TABLET | Freq: Every day | ORAL | 3 refills | Status: DC

## 2019-12-11 NOTE — Progress Notes (Signed)
She presents today after having not seen her for nearly 3 years with a chief complaint of pain to the right foot over the left foot particularly in the heels states that I noted that his plantar fasciitis and I know that I am going to have to get some orthotics to keep this from coming back.  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  She denies any traumas.  States that she did well for a long time with his left foot.   ROS: Denies fever chills nausea vomiting muscle aches pains calf pain back pain chest pain shortness of breath.  Objective: Vital signs are stable alert and oriented x3.  Pulses are palpable.  Neurologic sensorium is intact deep tendon reflexes are intact muscle strength is normal symmetrical.  Cutaneous evaluation demonstrates supple well-hydrated cutis no open lesions or wounds.  She has pain on direct palpation of the medial calcaneal tubercle of the bilateral heels otherwise all joints have full range of motion without crepitation.  Radiographs taken today demonstrate an increase in the soft tissue at the medial calcaneal insertion site consistent with plantar fasciitis no other acute findings noted.  Assessment: Probable recurrence of plantar fasciitis bilateral.  Plan: Injected the bilateral heels today with 20 mg of Kenalog 5 mg Marcaine point of maximal tenderness.  Tolerated procedure well without complications.  Started her on a Medrol Dosepak to be followed by meloxicam.  Dispensed plantar fascial braces and discussed the need for orthotics we went ahead and get her scheduled with Liliane Channel for orthotics.

## 2019-12-17 ENCOUNTER — Ambulatory Visit: Payer: Self-pay | Admitting: Podiatry

## 2019-12-18 ENCOUNTER — Telehealth: Payer: Self-pay | Admitting: Family Medicine

## 2019-12-18 DIAGNOSIS — F908 Attention-deficit hyperactivity disorder, other type: Secondary | ICD-10-CM

## 2019-12-18 DIAGNOSIS — F411 Generalized anxiety disorder: Secondary | ICD-10-CM

## 2019-12-18 NOTE — Telephone Encounter (Signed)
Patient called requesting to speak with PCP's CMA, she did not want to divulge details. CMA occupied at time of call, advised patient that I will have them contact her back when available.

## 2019-12-18 NOTE — Telephone Encounter (Signed)
This is a totally different story than what she expressed last OV when she said things were stable etc etc.    My rec at this time is she either come in to discuss these concerns with the new provider since I will not be here after next week or we can send her to psychiatry for evaluation and txmnt of her ADHD and GAD.  I will not be starting a new med on pt myself at this time.

## 2019-12-18 NOTE — Telephone Encounter (Signed)
Patient was last seen 11/13/2019 for ADHD. Adderall was d/ced and patient states she is taking Wellbutrin for her ADHD. Patient states she is having difficulty concentrating, being more irritable and fidgety / restless. Patient states she does not want to go back on Adderall but is questioning if she needs to increase wellbutrin dose or if these symptoms are normal.   Please review the below from that OV and advise. AS, CMA  ADHD, GAD - Stable at this time. - Per patient, discontinued Adderall at home after prescription ran out.   - Per patient, symptoms stable, well-controlled on Wellbutrin alone. - Six months supply of Wellbutrin provided today. - Continue treatment plan as established.  See med list.   - In addition to prescription intervention, reviewed the "spokes of the wheel" of mood and health management.  Stressed the importance of ongoing prudent habits, including regular exercise, appropriate sleep hygiene, healthful dietary habits, and prayer/meditation to relax.   - Advised patient to continue with regular exercise and meditation to manage her mood.   - Will continue to monitor.

## 2019-12-19 NOTE — Addendum Note (Signed)
Addended by: Mickel Crow on: 12/19/2019 08:19 AM   Modules accepted: Orders

## 2019-12-19 NOTE — Telephone Encounter (Signed)
Patient is aware of the below and requested referral to be placed for Psychiatry and to schedule apt with [facility]. Referral has been placed and call was transferred to front desk for scheduling. AS, CMA

## 2019-12-24 ENCOUNTER — Other Ambulatory Visit: Payer: Self-pay

## 2019-12-24 ENCOUNTER — Ambulatory Visit (INDEPENDENT_AMBULATORY_CARE_PROVIDER_SITE_OTHER): Payer: BC Managed Care – PPO | Admitting: Orthotics

## 2019-12-24 DIAGNOSIS — M722 Plantar fascial fibromatosis: Secondary | ICD-10-CM

## 2019-12-24 NOTE — Progress Notes (Signed)

## 2019-12-31 DIAGNOSIS — E039 Hypothyroidism, unspecified: Secondary | ICD-10-CM | POA: Diagnosis not present

## 2020-01-07 ENCOUNTER — Other Ambulatory Visit: Payer: Self-pay

## 2020-01-07 ENCOUNTER — Encounter (INDEPENDENT_AMBULATORY_CARE_PROVIDER_SITE_OTHER): Payer: Self-pay

## 2020-01-07 ENCOUNTER — Ambulatory Visit (INDEPENDENT_AMBULATORY_CARE_PROVIDER_SITE_OTHER): Payer: BC Managed Care – PPO | Admitting: Family Medicine

## 2020-01-14 ENCOUNTER — Other Ambulatory Visit: Payer: BC Managed Care – PPO | Admitting: Orthotics

## 2020-01-15 NOTE — Progress Notes (Deleted)
Telehealth office visit note for Lorrene Reid, PA-C- at Primary Care at [facility] Visit via Video Note  I connected with Ebony Hail L. Alley on 01/16/20 by a video enabled telemedicine application and verified that I am speaking with the correct person using two identifiers.   . Location of the patient: Home . Location of the provider: Office - This visit type was conducted due to national recommendations for restrictions regarding the COVID-19 Pandemic (e.g. social distancing) in an effort to limit this patient's exposure and mitigate transmission in our community.    - No physical exam could be performed with this format, beyond that communicated to [place] by the patient/ family members as noted.   - Additionally my office staff/ schedulers were to discuss with the patient that there may be a monetary charge related to this service, depending on their medical insurance.  My understanding is that patient understood and consented to proceed.     _________________________________________________________________________________   History of Present Illness: Pt presenting today for follow-up on mood management.      GAD 7 : Generalized Anxiety Score 11/13/2019 01/15/2019 12/11/2018 04/18/2018  Nervous, Anxious, on Edge 0 0 0 1  Control/stop worrying 0 0 0 0  Worry too much - different things 0 0 0 0  Trouble relaxing 0 0 0 2  Restless 0 0 3 3  Easily annoyed or irritable 0 0 1 2  Afraid - awful might happen 0 0 0 0  Total GAD 7 Score 0 0 4 8  Anxiety Difficulty - Not difficult at all Somewhat difficult Very difficult    Depression screen [facility] 2/9 11/13/2019 07/09/2019 01/15/2019 12/11/2018 10/01/2018  Decreased Interest 0 0 0 0 0  Down, Depressed, Hopeless 0 0 0 0 0  PHQ - 2 Score 0 0 0 0 0  Altered sleeping 0 0 0 0 0  Tired, decreased energy 1 0 0 2 1  Change in appetite 0 0 0 0 0  Feeling bad or failure about yourself  0 0 0 0 0  Trouble concentrating 0 3 0 2 3  Moving  slowly or fidgety/restless 0 0 0 0 1  Suicidal thoughts 0 0 0 0 0  PHQ-9 Score 1 3 0 4 5  Difficult doing work/chores Not difficult at all Not difficult at all Not difficult at all Somewhat difficult Somewhat difficult      Impression and Recommendations:     No diagnosis found.     - As part of my medical decision making, I reviewed the following data within the [place] History obtained from pt /family, CMA notes reviewed and incorporated if applicable, Labs reviewed, Radiograph/ tests reviewed if applicable and OV notes from prior OV's with me, as well as any other specialists she/he has seen since seeing me last, were all reviewed and used in my medical decision making process today.    - Additionally, when appropriate, discussion had with patient regarding our treatment plan, and their biases/concerns about that plan were used in my medical decision making today.    - The patient agreed with the plan and demonstrated an understanding of the instructions.   No barriers to understanding were identified.     - The patient was advised to call back or seek an in-person evaluation if the symptoms worsen or if the condition fails to improve as anticipated.   No follow-ups on file.    No orders of the defined types were  placed in this encounter.   No orders of the defined types were placed in this encounter.   There are no discontinued medications.     Time spent on visit including pre-visit chart review and post-visit care was *** minutes.      The [place] was signed into law in 2016 which includes the topic of electronic health records.  This provides immediate access to information in MyChart.  This includes consultation notes, operative notes, office notes, lab results and pathology reports.  If you have any questions about what you read please let us know at your next visit or call us at the office.  We are right here with  you.   __________________________________________________________________________________     Patient Care Team    Relationship Specialty Notifications Start End  Lorrene Reid, [place] PCP - General   12/29/19   [place], [facility]    04/18/18      -Vitals obtained; medications/ allergies reconciled;  personal medical, social, Sx etc.histories were updated by CMA, reviewed by me and are reflected in chart   Patient Active Problem List   Diagnosis Date Noted  . Attention deficit hyperactivity disorder 05/14/2019  . Counseling on health promotion and disease prevention 12/11/2018  . Feeling uptight 12/11/2018  . Excessive sweating 06/20/2018  . Loss of hair 06/20/2018  . Thin nails 06/20/2018  . GERD (gastroesophageal reflux disease) 04/18/2018  . Hypothyroidism 04/18/2018  . Generalized arthritis 04/18/2018  . Environmental and seasonal allergies 04/18/2018  . Transient gluten sensitivity 04/18/2018  . Other fatigue 04/18/2018  . Overweight (BMI 25.0-29.9) 04/18/2018  . GAD (generalized anxiety disorder) 01/10/2018  . Preventative health care 01/12/2016  . Rash and nonspecific skin eruption 01/12/2016  . ADHD (attention deficit hyperactivity disorder) 08/11/2015     No outpatient medications have been marked as taking for the 01/23/20 encounter (Appointment) with Lorrene Reid, PA-C.     Allergies:  No Known Allergies   ROS:  See above HPI for pertinent positives and negatives   Objective:   There were no vitals taken for this visit.  (if some vitals are omitted, this means that patient was UNABLE to obtain them even though they were asked to get them prior to OV today.  They were asked to call us at their earliest convenience with these once obtained. ) General: A & O * 3; sounds in no acute distress; in usual state of health.  Skin: Pt confirms warm and dry extremities and pink fingertips HEENT: Pt confirms lips non-cyanotic Chest: Patient  confirms normal chest excursion and movement Respiratory: speaking in full sentences, no conversational dyspnea; patient confirms no use of accessory muscles Psych: insight appears good, mood- appears full

## 2020-01-20 ENCOUNTER — Ambulatory Visit (INDEPENDENT_AMBULATORY_CARE_PROVIDER_SITE_OTHER): Payer: BC Managed Care – PPO | Admitting: Family Medicine

## 2020-01-21 ENCOUNTER — Ambulatory Visit (INDEPENDENT_AMBULATORY_CARE_PROVIDER_SITE_OTHER): Payer: BC Managed Care – PPO | Admitting: Family Medicine

## 2020-01-23 ENCOUNTER — Other Ambulatory Visit: Payer: Self-pay

## 2020-01-23 ENCOUNTER — Telehealth: Payer: BC Managed Care – PPO | Admitting: Physician Assistant

## 2020-01-24 ENCOUNTER — Encounter: Payer: Self-pay | Admitting: Physician Assistant

## 2020-01-24 ENCOUNTER — Telehealth (INDEPENDENT_AMBULATORY_CARE_PROVIDER_SITE_OTHER): Payer: BC Managed Care – PPO | Admitting: Physician Assistant

## 2020-01-24 DIAGNOSIS — R635 Abnormal weight gain: Secondary | ICD-10-CM | POA: Diagnosis not present

## 2020-01-24 DIAGNOSIS — F908 Attention-deficit hyperactivity disorder, other type: Secondary | ICD-10-CM

## 2020-01-24 DIAGNOSIS — F411 Generalized anxiety disorder: Secondary | ICD-10-CM

## 2020-01-24 NOTE — Progress Notes (Signed)
Telehealth office visit note for Amber Reid, PA-C- at Primary Care at [facility]   I connected with current patient today by telephone and verified that I am speaking with the correct person   . Location of the patient: Home . Location of the provider: Office - This visit type was conducted due to national recommendations for restrictions regarding the COVID-19 Pandemic (e.g. social distancing) in an effort to limit this patient's exposure and mitigate transmission in our community.    - No physical exam could be performed with this format, beyond that communicated to [place] by the patient/ family members as noted.   - Additionally my office staff/ schedulers were to discuss with the patient that there may be a monetary charge related to this service, depending on their medical insurance.  My understanding is that patient understood and consented to proceed.     _________________________________________________________________________________   History of Present Illness: Pt calls in for followup on ADHD and GAD. She would like to discuss making adjustments to her current medication regimen. Reports Wellbutrin was helping at first but has noticed having trouble concentrating at work, her anxiety has increased and she becomes irritable at herself. She meditates in the morning which helps intermittently. She also complains of weight gain of about 20-25 pounds within the past 4-5 months. She has made dietary changes that is gluten free, includes lean meats, and has reduced dairy due to IBS. She is pretty active with cardio and yoga. She was referred to endocrinology 04/21 for hypothyroid management and levothyroxine medication was adjusted to 75 mcg.   ADHD: Pt reports Adderall was discontinued last office visit and doesn't want to go back on it.    GAD 7 : Generalized Anxiety Score 01/24/2020 11/13/2019 01/15/2019 12/11/2018  Nervous, Anxious, on Edge 1 0 0 0  Control/stop worrying 1 0 0 0   Worry too much - different things 1 0 0 0  Trouble relaxing 1 0 0 0  Restless 1 0 0 3  Easily annoyed or irritable 1 0 0 1  Afraid - awful might happen 1 0 0 0  Total GAD 7 Score 7 0 0 4  Anxiety Difficulty Somewhat difficult - Not difficult at all Somewhat difficult    Depression screen [facility] 2/9 01/24/2020 11/13/2019 07/09/2019 01/15/2019 12/11/2018  Decreased Interest 1 0 0 0 0  Down, Depressed, Hopeless 1 0 0 0 0  PHQ - 2 Score 2 0 0 0 0  Altered sleeping 1 0 0 0 0  Tired, decreased energy 2 1 0 0 2  Change in appetite 1 0 0 0 0  Feeling bad or failure about yourself  1 0 0 0 0  Trouble concentrating 1 0 3 0 2  Moving slowly or fidgety/restless 1 0 0 0 0  Suicidal thoughts 0 0 0 0 0  PHQ-9 Score 9 1 3  0 4  Difficult doing work/chores Somewhat difficult Not difficult at all Not difficult at all Not difficult at all Somewhat difficult  Some recent data might be hidden      Impression and Recommendations:     1. GAD (generalized anxiety disorder)   2. Attention deficit hyperactivity disorder (ADHD), other type   3. Weight gain     GAD, ADHD, Wt gain: - Discussed with patient increasing dose of Wellbutrin to 300 mg and she is agreeable.  - The increased dose will help with improve anxiety, concentration, and weight loss. - Continue non-pharmacologic therapy- meditation, yoga, exercise.  -  Advised to take 2 tablets of 150 mg and if effective to let me know and will send new rx for 300 mg.  - Follow-up in 6-8 weeks to reassess symptoms and medication therapy.    - As part of my medical decision making, I reviewed the following data within the [place] History obtained from pt /family, CMA notes reviewed and incorporated if applicable, Labs reviewed, Radiograph/ tests reviewed if applicable and OV notes from prior OV's with me, as well as any other specialists she/he has seen since seeing me last, were all reviewed and used in my medical decision making process  today.    - Additionally, when appropriate, discussion had with patient regarding our treatment plan, and their biases/concerns about that plan were used in my medical decision making today.    - The patient agreed with the plan and demonstrated an understanding of the instructions.   No barriers to understanding were identified.     - The patient was advised to call back or seek an in-person evaluation if the symptoms worsen or if the condition fails to improve as anticipated.   Return for Mood- GAD, increased Wellbutrin in 6-8 weeks.    No orders of the defined types were placed in this encounter.   No orders of the defined types were placed in this encounter.   Medications Discontinued During This Encounter  Medication Reason  . dextromethorphan-guaiFENesin (MUCINEX DM) 30-600 MG 12hr tablet No longer needed (for PRN medications)  . levothyroxine (SYNTHROID) 50 MCG tablet Dose change  . methylPREDNISolone (MEDROL DOSEPAK) 4 MG TBPK tablet Completed Course       Time spent on visit including pre-visit chart review and post-visit care was 21 minutes.      The [place] was signed into law in 2016 which includes the topic of electronic health records.  This provides immediate access to information in MyChart.  This includes consultation notes, operative notes, office notes, lab results and pathology reports.  If you have any questions about what you read please let us know at your next visit or call us at the office.  We are right here with you.   __________________________________________________________________________________     Patient Care Team    Relationship Specialty Notifications Start End  Amber Murillo, [place] PCP - General   12/29/19   [place], [facility]    04/18/18      -Vitals obtained; medications/ allergies reconciled;  personal medical, social, Sx etc.histories were updated by CMA, reviewed by me and are reflected in  chart   Patient Active Problem List   Diagnosis Date Noted  . Attention deficit hyperactivity disorder 05/14/2019  . Counseling on health promotion and disease prevention 12/11/2018  . Feeling uptight 12/11/2018  . Excessive sweating 06/20/2018  . Loss of hair 06/20/2018  . Thin nails 06/20/2018  . GERD (gastroesophageal reflux disease) 04/18/2018  . Hypothyroidism 04/18/2018  . Generalized arthritis 04/18/2018  . Environmental and seasonal allergies 04/18/2018  . Transient gluten sensitivity 04/18/2018  . Other fatigue 04/18/2018  . Overweight (BMI 25.0-29.9) 04/18/2018  . GAD (generalized anxiety disorder) 01/10/2018  . Preventative health care 01/12/2016  . Rash and nonspecific skin eruption 01/12/2016  . ADHD (attention deficit hyperactivity disorder) 08/11/2015     Current Meds  Medication Sig  . b complex vitamins tablet Take 1 tablet by mouth daily.  . Biotin w/ Vitamins C & E (HAIR/SKIN/NAILS PO) Take 1 tablet by mouth daily.  Marland Kitchen  buPROPion (WELLBUTRIN XL) 150 MG 24 hr tablet TAKE 1 TABLET BY MOUTH EVERY DAY IN THE MORNING  . cetirizine (ZYRTEC) 10 MG tablet Take 10 mg by mouth daily as needed for allergies.  . Cholecalciferol (VITAMIN D3 PO) Take 1 tablet by mouth daily.  Marland Kitchen levothyroxine (SYNTHROID) 75 MCG tablet Take 75 mcg by mouth daily.  . meloxicam (MOBIC) 15 MG tablet Take 1 tablet (15 mg total) by mouth daily.  . Multiple Vitamins-Calcium (ONE-A-DAY WOMENS FORMULA PO) Take 1 tablet by mouth daily.  Marland Kitchen oxybutynin (DITROPAN-XL) 5 MG 24 hr tablet Take 1 tablet (5 mg total) by mouth at bedtime.     Allergies:  No Known Allergies   ROS:  See above HPI for pertinent positives and negatives   Objective:   There were no vitals taken for this visit.  (if some vitals are omitted, this means that patient was UNABLE to obtain them even though they were asked to get them prior to OV today.  They were asked to call us at their earliest convenience with these once  obtained. ) General: A & O * 3; sounds in no acute distress; in usual state of health.  Respiratory: speaking in full sentences, no conversational dyspnea Psych: insight appears good, mood- appears full

## 2020-02-03 ENCOUNTER — Ambulatory Visit (INDEPENDENT_AMBULATORY_CARE_PROVIDER_SITE_OTHER): Payer: BC Managed Care – PPO | Admitting: Family Medicine

## 2020-02-03 ENCOUNTER — Ambulatory Visit: Payer: BC Managed Care – PPO | Admitting: Orthotics

## 2020-02-03 ENCOUNTER — Other Ambulatory Visit: Payer: Self-pay

## 2020-02-03 DIAGNOSIS — M722 Plantar fascial fibromatosis: Secondary | ICD-10-CM

## 2020-02-03 NOTE — Progress Notes (Signed)
Patient came in today to pick up custom made foot orthotics.  The goals were accomplished and the patient reported no dissatisfaction with said orthotics.  Patient was advised of breakin period and how to report any issues.Patient came in today to pick up custom made foot orthotics.  The goals were accomplished and the patient reported no dissatisfaction with said orthotics.  Patient was advised of breakin period and how to report any issues.

## 2020-02-11 DIAGNOSIS — E039 Hypothyroidism, unspecified: Secondary | ICD-10-CM | POA: Diagnosis not present

## 2020-02-11 DIAGNOSIS — R635 Abnormal weight gain: Secondary | ICD-10-CM | POA: Diagnosis not present

## 2020-02-25 ENCOUNTER — Ambulatory Visit: Payer: BC Managed Care – PPO | Admitting: Podiatry

## 2020-02-28 ENCOUNTER — Ambulatory Visit: Payer: BC Managed Care – PPO | Admitting: Podiatry

## 2020-03-03 DIAGNOSIS — Z6829 Body mass index (BMI) 29.0-29.9, adult: Secondary | ICD-10-CM | POA: Diagnosis not present

## 2020-03-03 DIAGNOSIS — R635 Abnormal weight gain: Secondary | ICD-10-CM | POA: Diagnosis not present

## 2020-03-03 DIAGNOSIS — E039 Hypothyroidism, unspecified: Secondary | ICD-10-CM | POA: Diagnosis not present

## 2020-03-26 ENCOUNTER — Ambulatory Visit: Payer: BC Managed Care – PPO | Admitting: Podiatry

## 2020-04-02 ENCOUNTER — Ambulatory Visit: Payer: BC Managed Care – PPO | Admitting: Podiatry

## 2020-04-15 DIAGNOSIS — Z01419 Encounter for gynecological examination (general) (routine) without abnormal findings: Secondary | ICD-10-CM | POA: Diagnosis not present

## 2020-04-15 DIAGNOSIS — Z6829 Body mass index (BMI) 29.0-29.9, adult: Secondary | ICD-10-CM | POA: Diagnosis not present

## 2020-04-15 LAB — HM PAP SMEAR: HM Pap smear: NORMAL

## 2020-06-02 DIAGNOSIS — Z20822 Contact with and (suspected) exposure to covid-19: Secondary | ICD-10-CM | POA: Diagnosis not present

## 2020-06-04 DIAGNOSIS — U071 COVID-19: Secondary | ICD-10-CM | POA: Diagnosis not present

## 2020-06-04 DIAGNOSIS — Z20822 Contact with and (suspected) exposure to covid-19: Secondary | ICD-10-CM | POA: Diagnosis not present

## 2020-06-14 DIAGNOSIS — Z20822 Contact with and (suspected) exposure to covid-19: Secondary | ICD-10-CM | POA: Diagnosis not present

## 2020-06-21 DIAGNOSIS — Z20822 Contact with and (suspected) exposure to covid-19: Secondary | ICD-10-CM | POA: Diagnosis not present

## 2020-06-28 DIAGNOSIS — M791 Myalgia, unspecified site: Secondary | ICD-10-CM | POA: Diagnosis not present

## 2020-06-28 DIAGNOSIS — W57XXXA Bitten or stung by nonvenomous insect and other nonvenomous arthropods, initial encounter: Secondary | ICD-10-CM | POA: Diagnosis not present

## 2020-07-06 DIAGNOSIS — E039 Hypothyroidism, unspecified: Secondary | ICD-10-CM | POA: Diagnosis not present

## 2020-07-06 DIAGNOSIS — R635 Abnormal weight gain: Secondary | ICD-10-CM | POA: Diagnosis not present

## 2020-08-05 DIAGNOSIS — N644 Mastodynia: Secondary | ICD-10-CM | POA: Diagnosis not present

## 2020-11-30 ENCOUNTER — Other Ambulatory Visit: Payer: Self-pay | Admitting: Physician Assistant

## 2020-11-30 DIAGNOSIS — E039 Hypothyroidism, unspecified: Secondary | ICD-10-CM

## 2020-12-04 ENCOUNTER — Other Ambulatory Visit: Payer: Self-pay | Admitting: Physician Assistant

## 2020-12-04 DIAGNOSIS — Z0000 Encounter for general adult medical examination without abnormal findings: Secondary | ICD-10-CM

## 2020-12-04 DIAGNOSIS — E039 Hypothyroidism, unspecified: Secondary | ICD-10-CM

## 2020-12-10 ENCOUNTER — Other Ambulatory Visit: Payer: Self-pay

## 2020-12-10 DIAGNOSIS — E039 Hypothyroidism, unspecified: Secondary | ICD-10-CM

## 2020-12-10 DIAGNOSIS — Z0000 Encounter for general adult medical examination without abnormal findings: Secondary | ICD-10-CM

## 2020-12-11 LAB — COMPREHENSIVE METABOLIC PANEL
ALT: 19 IU/L (ref 0–32)
AST: 17 IU/L (ref 0–40)
Albumin/Globulin Ratio: 1.4 (ref 1.2–2.2)
Albumin: 4.1 g/dL (ref 3.9–5.0)
Alkaline Phosphatase: 92 IU/L (ref 44–121)
BUN/Creatinine Ratio: 17 (ref 9–23)
BUN: 14 mg/dL (ref 6–20)
Bilirubin Total: 0.3 mg/dL (ref 0.0–1.2)
CO2: 24 mmol/L (ref 20–29)
Calcium: 9.2 mg/dL (ref 8.7–10.2)
Chloride: 100 mmol/L (ref 96–106)
Creatinine, Ser: 0.83 mg/dL (ref 0.57–1.00)
Globulin, Total: 3 g/dL (ref 1.5–4.5)
Glucose: 74 mg/dL (ref 65–99)
Potassium: 4.3 mmol/L (ref 3.5–5.2)
Sodium: 139 mmol/L (ref 134–144)
Total Protein: 7.1 g/dL (ref 6.0–8.5)
eGFR: 100 mL/min/{1.73_m2} (ref >59)

## 2020-12-11 LAB — LIPID PANEL
Chol/HDL Ratio: 5.9 ratio — ABNORMAL HIGH (ref 0.0–4.4)
Cholesterol, Total: 188 mg/dL (ref 100–199)
HDL: 32 mg/dL — ABNORMAL LOW (ref >39)
LDL Chol Calc (NIH): 119 mg/dL — ABNORMAL HIGH (ref 0–99)
Triglycerides: 206 mg/dL — ABNORMAL HIGH (ref 0–149)
VLDL Cholesterol Cal: 37 mg/dL (ref 5–40)

## 2020-12-11 LAB — CBC
Hematocrit: 42.7 % (ref 34.0–46.6)
Hemoglobin: 14.6 g/dL (ref 11.1–15.9)
MCH: 30.1 pg (ref 26.6–33.0)
MCHC: 34.2 g/dL (ref 31.5–35.7)
MCV: 88 fL (ref 79–97)
Platelets: 339 10*3/uL (ref 150–450)
RBC: 4.85 x10E6/uL (ref 3.77–5.28)
RDW: 12.8 % (ref 11.7–15.4)
WBC: 8.9 10*3/uL (ref 3.4–10.8)

## 2020-12-11 LAB — HEMOGLOBIN A1C
Est. average glucose Bld gHb Est-mCnc: 100 mg/dL
Hgb A1c MFr Bld: 5.1 % (ref 4.8–5.6)

## 2020-12-11 LAB — TSH: TSH: 4.13 u[IU]/mL (ref 0.450–4.500)

## 2020-12-14 ENCOUNTER — Other Ambulatory Visit: Payer: Self-pay

## 2020-12-14 ENCOUNTER — Encounter: Payer: Self-pay | Admitting: Physician Assistant

## 2020-12-14 ENCOUNTER — Ambulatory Visit (INDEPENDENT_AMBULATORY_CARE_PROVIDER_SITE_OTHER): Payer: Managed Care, Other (non HMO) | Admitting: Physician Assistant

## 2020-12-14 VITALS — BP 117/78 | HR 82 | Temp 98.3°F | Ht 61.0 in | Wt 170.4 lb

## 2020-12-14 DIAGNOSIS — F411 Generalized anxiety disorder: Secondary | ICD-10-CM

## 2020-12-14 DIAGNOSIS — E039 Hypothyroidism, unspecified: Secondary | ICD-10-CM

## 2020-12-14 DIAGNOSIS — Z0000 Encounter for general adult medical examination without abnormal findings: Secondary | ICD-10-CM | POA: Diagnosis not present

## 2020-12-14 DIAGNOSIS — E669 Obesity, unspecified: Secondary | ICD-10-CM | POA: Diagnosis not present

## 2020-12-14 DIAGNOSIS — Z6832 Body mass index (BMI) 32.0-32.9, adult: Secondary | ICD-10-CM

## 2020-12-14 NOTE — Patient Instructions (Signed)
Preventive Care 11-27 Years Old, Female Preventive care refers to lifestyle choices and visits with your health care provider that can promote health and wellness. This includes:  A yearly physical exam. This is also called an annual wellness visit.  Regular dental and eye exams.  Immunizations.  Screening for certain conditions.  Healthy lifestyle choices, such as: ? Eating a healthy diet. ? Getting regular exercise. ? Not using drugs or products that contain nicotine and tobacco. ? Limiting alcohol use. What can I expect for my preventive care visit? Physical exam Your health care provider may check your:  Height and weight. These may be used to calculate your BMI (body mass index). BMI is a measurement that tells if you are at a healthy weight.  Heart rate and blood pressure.  Body temperature.  Skin for abnormal spots. Counseling Your health care provider may ask you questions about your:  Past medical problems.  Family's medical history.  Alcohol, tobacco, and drug use.  Emotional well-being.  Home life and relationship well-being.  Sexual activity.  Diet, exercise, and sleep habits.  Work and work Statistician.  Access to firearms.  Method of birth control.  Menstrual cycle.  Pregnancy history. What immunizations do I need? Vaccines are usually given at various ages, according to a schedule. Your health care provider will recommend vaccines for you based on your age, medical history, and lifestyle or other factors, such as travel or where you work.   What tests do I need? Blood tests  Lipid and cholesterol levels. These may be checked every 5 years starting at age 64.  Hepatitis C test.  Hepatitis B test. Screening  Diabetes screening. This is done by checking your blood sugar (glucose) after you have not eaten for a while (fasting).  STD (sexually transmitted disease) testing, if you are at risk.  BRCA-related cancer screening. This may  be done if you have a family history of breast, ovarian, tubal, or peritoneal cancers.  Pelvic exam and Pap test. This may be done every 3 years starting at age 61. Starting at age 82, this may be done every 5 years if you have a Pap test in combination with an HPV test. Talk with your health care provider about your test results, treatment options, and if necessary, the need for more tests.   Follow these instructions at home: Eating and drinking  Eat a healthy diet that includes fresh fruits and vegetables, whole grains, lean protein, and low-fat dairy products.  Take vitamin and mineral supplements as recommended by your health care provider.  Do not drink alcohol if: ? Your health care provider tells you not to drink. ? You are pregnant, may be pregnant, or are planning to become pregnant.  If you drink alcohol: ? Limit how much you have to 0-1 drink a day. ? Be aware of how much alcohol is in your drink. In the [place] one drink equals one 12 oz bottle of beer (355 mL), one 5 oz glass of wine (148 mL), or one 1 oz glass of hard liquor (44 mL).   Lifestyle  Take daily care of your teeth and gums. Brush your teeth every morning and night with fluoride toothpaste. Floss one time each day.  Stay active. Exercise for at least 30 minutes 5 or more days each week.  Do not use any products that contain nicotine or tobacco, such as cigarettes, e-cigarettes, and chewing tobacco. If you need help quitting, ask your health care provider.  Do  not use drugs.  If you are sexually active, practice safe sex. Use a condom or other form of protection to prevent STIs (sexually transmitted infections).  If you do not wish to become pregnant, use a form of birth control. If you plan to become pregnant, see your health care provider for a prepregnancy visit.  Find healthy ways to cope with stress, such as: ? Meditation, yoga, or listening to music. ? Journaling. ? Talking to a trusted  person. ? Spending time with friends and family. Safety  Always wear your seat belt while driving or riding in a vehicle.  Do not drive: ? If you have been drinking alcohol. Do not ride with someone who has been drinking. ? When you are tired or distracted. ? While texting.  Wear a helmet and other protective equipment during sports activities.  If you have firearms in your house, make sure you follow all gun safety procedures.  Seek help if you have been physically or sexually abused. What's next?  Go to your health care provider once a year for an annual wellness visit.  Ask your health care provider how often you should have your eyes and teeth checked.  Stay up to date on all vaccines. This information is not intended to replace advice given to you by your health care provider. Make sure you discuss any questions you have with your health care provider. Document Revised: 04/12/2020 Document Reviewed: 04/26/2018 Elsevier Patient Education  2021 [employer].

## 2020-12-14 NOTE — Progress Notes (Signed)
Subjective:     Amber Murillo is a 25 y.o. female and is here for a comprehensive physical exam. The patient reports no problems.  Social History   Socioeconomic History  . Marital status: Married    Spouse name: Not on file  . Number of children: Not on file  . Years of education: Not on file  . Highest education level: Not on file  Occupational History  . Not on file  Tobacco Use  . Smoking status: Never Smoker  . Smokeless tobacco: Never Used  Vaping Use  . Vaping Use: Never used  Substance and Sexual Activity  . Alcohol use: No    Alcohol/week: 0.0 standard drinks  . Drug use: No  . Sexual activity: Yes    Birth control/protection: I.U.D.  Other Topics Concern  . Not on file  Social History Narrative   Single.   No children.   Works as a Theme park manager.   Is in school at [employer] for PT.   Enjoys playing softball and riding horses.    Social Determinants of Health   Financial Resource Strain: Not on file  Food Insecurity: Not on file  Transportation Needs: Not on file  Physical Activity: Not on file  Stress: Not on file  Social Connections: Not on file  Intimate Partner Violence: Not on file   Health Maintenance  Topic Date Due  . PAP-Cervical Cytology Screening  Never done  . PAP SMEAR-Modifier  Never done  . COVID-19 Vaccine (1) 12/30/2020 (Originally 06/19/1999)  . Hepatitis C Screening  12/14/2021 (Originally [birth year])  . HIV Screening  12/14/2021 (Originally 06/18/2009)  . INFLUENZA VACCINE  03/29/2021  . TETANUS/TDAP  01/11/2026  . HPV VACCINES  Completed    The following portions of the patient's history were reviewed and updated as appropriate: allergies, current medications, past family history, past medical history, past social history, past surgical history and problem list.  Review of Systems Pertinent items noted in HPI and remainder of comprehensive ROS otherwise negative.   Objective:    BP 117/78   Pulse 82   Temp 98.3 F (36.8  C)   Ht 5' 1"  (1.549 m)   Wt 170 lb 6.4 oz (77.3 kg)   SpO2 98%   BMI 32.20 kg/m  General appearance: alert, cooperative and no distress Head: Normocephalic, without obvious abnormality, atraumatic Eyes: conjunctivae/corneas clear. PERRL, EOM's intact. Fundi benign. Ears: normal TM's and external ear canals both ears Nose: Nares normal. Septum midline. Mucosa normal. No drainage or sinus tenderness. Throat: lips, mucosa, and tongue normal; teeth and gums normal Neck: no adenopathy, supple, symmetrical, trachea midline and thyroid not enlarged, symmetric, no tenderness/mass/nodules Back: symmetric, no curvature. ROM normal. No CVA tenderness. Lungs: clear to auscultation bilaterally Heart: regular rate and rhythm, S1, S2 normal, no murmur, click, rub or gallop Abdomen: soft, non-tender; bowel sounds normal; no masses,  no organomegaly Extremities: extremities normal, atraumatic, no cyanosis or edema Pulses: 2+ and symmetric Skin: Skin color, texture, turgor normal. No rashes or lesions Lymph nodes: Cervical adenopathy: normal and Supraclavicular adenopathy: normal Neurologic: Grossly normal    Assessment:    Healthy female exam.      Plan:  -Continue to follow up with OB-GYN ([facility]), will request most recent pap smear. -UTD on immunizations. Declined Hep C and HIV screenings. -Discussed most recent labs, most are essentially within normal limits or stable from prior with the exception of lipid panel. -Recommend to resume exercise regimen of at least 150  mins/wk, continue Yoga and meditation.  -Follow a heart healthy diet and reduce saturated and trans fats. Stay well hydrated. -BMI 32.20: associated with hyperlipidemia -Continue to follow up with Endocrinology. -Follow up in 1 year for CPE and FBW or sooner if needed    See After Visit Summary for Counseling Recommendations

## 2020-12-15 ENCOUNTER — Telehealth: Payer: Self-pay | Admitting: Physician Assistant

## 2020-12-15 NOTE — Telephone Encounter (Signed)
Patient is asking for a nurse call back concerning a Rx for perspiration and odor- please call at [phone]

## 2020-12-16 NOTE — Telephone Encounter (Signed)
Pt has several concerns with excessive sweating and has tried and failed several medications. Pt has also seen a Dermatologist. I suggested pt schedule apt to discuss issue with Maritza. AS, CMA

## 2020-12-22 ENCOUNTER — Ambulatory Visit: Payer: Self-pay | Admitting: Physician Assistant

## 2021-03-29 ENCOUNTER — Encounter: Payer: Self-pay | Admitting: Physician Assistant

## 2021-03-29 DIAGNOSIS — T63441A Toxic effect of venom of bees, accidental (unintentional), initial encounter: Secondary | ICD-10-CM

## 2021-03-30 ENCOUNTER — Other Ambulatory Visit: Payer: Self-pay

## 2021-03-30 MED ORDER — EPINEPHRINE 0.3 MG/0.3ML IJ SOAJ: 0.3000 mg | INTRAMUSCULAR | 1 refills | Status: AC | PRN

## 2021-04-08 ENCOUNTER — Encounter: Payer: Self-pay | Admitting: Physician Assistant

## 2021-05-24 ENCOUNTER — Encounter: Payer: Self-pay | Admitting: Physician Assistant

## 2021-06-25 ENCOUNTER — Encounter: Payer: Self-pay | Admitting: Physician Assistant

## 2021-07-06 ENCOUNTER — Ambulatory Visit: Payer: Managed Care, Other (non HMO) | Admitting: Physician Assistant

## 2021-08-19 ENCOUNTER — Other Ambulatory Visit: Payer: Self-pay

## 2021-08-19 ENCOUNTER — Ambulatory Visit (INDEPENDENT_AMBULATORY_CARE_PROVIDER_SITE_OTHER): Payer: Managed Care, Other (non HMO) | Admitting: Physician Assistant

## 2021-08-19 ENCOUNTER — Encounter: Payer: Self-pay | Admitting: Physician Assistant

## 2021-08-19 VITALS — BP 139/78 | HR 104 | Temp 98.2°F | Ht 61.0 in | Wt 164.0 lb

## 2021-08-19 DIAGNOSIS — J019 Acute sinusitis, unspecified: Secondary | ICD-10-CM | POA: Diagnosis not present

## 2021-08-19 DIAGNOSIS — J029 Acute pharyngitis, unspecified: Secondary | ICD-10-CM

## 2021-08-19 LAB — POCT INFLUENZA A/B
Influenza A, POC: NEGATIVE
Influenza B, POC: NEGATIVE

## 2021-08-19 LAB — POCT RAPID STREP A (OFFICE): Rapid Strep A Screen: NEGATIVE

## 2021-08-19 MED ORDER — AMOXICILLIN 875 MG PO TABS: 875.0000 mg | ORAL_TABLET | Freq: Two times a day (BID) | ORAL | 0 refills | Status: AC

## 2021-08-19 NOTE — Progress Notes (Signed)
Acute Office Visit  Subjective:    Patient ID: Amber Murillo, female    DOB: 17-Oct-1995, 25 y.o.   MRN: [id number]  Chief Complaint  Patient presents with   Sore Throat    HPI Patient is in today for c/o postnasal drainage, sore throat, ear fullness, headache and some sinus pressure. Symptoms started with drainage 2 days ago. No cough, fever, earache, chills, night sweats, diarrhea, vomiting or exposure to sick contacts. Patient is [redacted] weeks pregnant and has not tried anything for symptoms.  Past Medical History:  Diagnosis Date   ADHD (attention deficit hyperactivity disorder)    GERD (gastroesophageal reflux disease)    Thyroid disease     History reviewed. No pertinent surgical history.  Family History  Problem Relation Age of Onset   Hyperlipidemia Father    Hypothyroidism Maternal Grandmother    Hashimoto's thyroiditis Maternal Grandmother     Social History   Socioeconomic History   Marital status: Married    Spouse name: Not on file   Number of children: Not on file   Years of education: Not on file   Highest education level: Not on file  Occupational History   Not on file  Tobacco Use   Smoking status: Never   Smokeless tobacco: Never  Vaping Use   Vaping Use: Never used  Substance and Sexual Activity   Alcohol use: No    Alcohol/week: 0.0 standard drinks   Drug use: No   Sexual activity: Yes    Birth control/protection: I.U.D.  Other Topics Concern   Not on file  Social History Narrative   Single.   No children.   Works as a Theme park manager.   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   Enjoys playing softball and riding horses.    Social Determinants of Health   Financial Resource Strain: Not on file  Food Insecurity: Not on file  Transportation Needs: Not on file  Physical Activity: Not on file  Stress: Not on file  Social Connections: Not on file  Intimate Partner Violence: Not on file    Outpatient Medications Prior to Visit  Medication Sig  Dispense Refill   EPINEPHrine 0.3 mg/0.3 mL IJ SOAJ injection Inject 0.3 mg into the muscle as needed for anaphylaxis. 1 each 1   levothyroxine (SYNTHROID) 75 MCG tablet Take 75 mcg by mouth daily.     Prenatal Vit-Fe Fumarate-FA (PRENATAL MULTIVITAMIN) TABS tablet Take 1 tablet by mouth daily at 12 noon.     b complex vitamins tablet Take 1 tablet by mouth daily.     Biotin w/ Vitamins C & E (HAIR/SKIN/NAILS PO) Take 1 tablet by mouth daily.     cetirizine (ZYRTEC) 10 MG tablet Take 10 mg by mouth daily as needed for allergies.     Cholecalciferol (VITAMIN D3 PO) Take 1 tablet by mouth daily.     meloxicam (MOBIC) 15 MG tablet Take 1 tablet (15 mg total) by mouth daily. 30 tablet 3   Multiple Vitamins-Calcium (ONE-A-DAY WOMENS FORMULA PO) Take 1 tablet by mouth daily. (Patient not taking: Reported on 12/14/2020)     No facility-administered medications prior to visit.    No Known Allergies  Review of Systems Review of Systems:  A fourteen system review of systems was performed and found to be positive as per HPI.    Objective:    Physical Exam General:  Well Developed, well nourished, appropriate for stated age.  Neuro:  Alert and oriented,  extra-ocular  muscles intact  HEENT:  Normocephalic, atraumatic, mild tenderness/pressure of maxillary sinus, fluid behind both TM's, negative Tragus sign bilaterally, boggy turbinates b/l, slight white-yellow nasal drainage with dry blood noted, erythema of posterior oropharynx with enlarged tonsils (1+) w/o exudates, neck supple, no adenopathy  Skin:  no gross rash, warm, pink. Cardiac:  RRR, S1 S2 Respiratory: CTA B/L w/o wheezing, crackles or rales. Vascular:  Ext warm, no cyanosis apprec.; cap RF less 2 sec. Psych:  No HI/SI, judgement and insight good, Euthymic mood. Full Affect.  BP 139/78    Pulse (!) 104    Temp 98.2 F (36.8 C)    Ht _0  (1.549 m)    Wt 164 lb (74.4 kg)    SpO2 96%    BMI 30.99 kg/m  Wt Readings from Last 3  Encounters:  08/19/21 164 lb (74.4 kg)  12/14/20 170 lb 6.4 oz (77.3 kg)  11/13/19 160 lb (72.6 kg)    Health Maintenance Due  Topic Date Due   COVID-19 Vaccine (1) Never done   Pneumococcal Vaccine 41-33 Years old (1 - PCV) Never done   INFLUENZA VACCINE  Never done    There are no preventive care reminders to display for this patient.   Lab Results  Component Value Date   TSH 4.130 12/10/2020   Lab Results  Component Value Date   WBC 8.9 12/10/2020   HGB 14.6 12/10/2020   HCT 42.7 12/10/2020   MCV 88 12/10/2020   PLT 339 12/10/2020   Lab Results  Component Value Date   NA 139 12/10/2020   K 4.3 12/10/2020   CO2 24 12/10/2020   GLUCOSE 74 12/10/2020   BUN 14 12/10/2020   CREATININE 0.83 12/10/2020   BILITOT 0.3 12/10/2020   ALKPHOS 92 12/10/2020   AST 17 12/10/2020   ALT 19 12/10/2020   PROT 7.1 12/10/2020   ALBUMIN 4.1 12/10/2020   CALCIUM 9.2 12/10/2020   ANIONGAP 5 08/27/2016   EGFR 100 12/10/2020   GFR 100.03 01/12/2016   Lab Results  Component Value Date   CHOL 188 12/10/2020   Lab Results  Component Value Date   HDL 32 (L) 12/10/2020   Lab Results  Component Value Date   LDLCALC 119 (H) 12/10/2020   Lab Results  Component Value Date   TRIG 206 (H) 12/10/2020   Lab Results  Component Value Date   CHOLHDL 5.9 (H) 12/10/2020   Lab Results  Component Value Date   HGBA1C 5.1 12/10/2020       Assessment & Plan:   Problem List Items Addressed This Visit   None Visit Diagnoses     Acute sore throat    -  Primary   Relevant Orders   POCT Influenza A/B (Completed)   POCT rapid strep A (Completed)   Novel Coronavirus, NAA ([facility])   Acute non-recurrent sinusitis, unspecified location       Relevant Medications   amoxicillin (AMOXIL) 875 MG tablet      Discussed with patient has s/sx suggestive of sinusitis. Symptoms ongoing <7 days so likely viral, influenza and rapid strep are negative. Covid-19 test pending. Given office will be  closed due to the holiday will send antibiotic- amoxicillin to start if symptoms fail to improve or worsen. Discussed bacterial URI can be secondary to viral infection. Recommend nasal rinses with saline and supportive care including Tylenol as needed for headache.   Meds ordered this encounter  Medications   amoxicillin (AMOXIL) 875 MG tablet  Sig: Take 1 tablet (875 mg total) by mouth 2 (two) times daily for 10 days.    Dispense:  20 tablet    Refill:  0    Order Specific Question:   Supervising Provider    Answer:   Beatrice Lecher D [[id number]]     Lorrene Reid, PA-C

## 2021-08-20 LAB — NOVEL CORONAVIRUS, NAA: SARS-CoV-2, NAA: NOT DETECTED

## 2021-08-20 LAB — SARS-COV-2, NAA 2 DAY TAT

## 2021-08-26 LAB — OB RESULTS CONSOLE ABO/RH: RH Type: POSITIVE

## 2021-08-26 LAB — OB RESULTS CONSOLE RUBELLA ANTIBODY, IGM: Rubella: IMMUNE

## 2021-08-26 LAB — OB RESULTS CONSOLE RPR: RPR: NONREACTIVE

## 2021-08-26 LAB — OB RESULTS CONSOLE ANTIBODY SCREEN: Antibody Screen: NEGATIVE

## 2021-08-26 LAB — OB RESULTS CONSOLE GC/CHLAMYDIA
Chlamydia: NEGATIVE
Neisseria Gonorrhea: NEGATIVE

## 2021-08-26 LAB — OB RESULTS CONSOLE HEPATITIS B SURFACE ANTIGEN: Hepatitis B Surface Ag: NEGATIVE

## 2021-08-26 LAB — HEPATITIS C ANTIBODY: HCV Ab: NEGATIVE

## 2021-08-26 LAB — OB RESULTS CONSOLE HIV ANTIBODY (ROUTINE TESTING): HIV: NONREACTIVE

## 2021-08-29 NOTE — L&D Delivery Note (Signed)
Operative Delivery Note At 8:37 PM a viable female was delivered via Vaginal, Vacuum Neurosurgeon).  Presentation: vertex; Position: Left,, Occiput,, Anterior; Station: +3.  Patient had inadequate pain control and was unable to effectively push.  She was offered C/S vs vacuum assist and elected to proceed with vacuum.  Verbal consent: obtained from patient.  Risks and benefits discussed in detail.  Risks include, but are not limited to the risks of anesthesia, bleeding, infection, damage to maternal tissues, fetal cephalhematoma.  There is also the risk of inability to effect vaginal delivery of the head, or shoulder dystocia that cannot be resolved by established maneuvers, leading to the need for emergency cesarean section.  APGAR: 9, 9; weight pending.   Placenta status: S, I.   3V Cord with the following complications: none.  Cord pH: n/a  Anesthesia:  CLEA Instruments: Kiwi (3 push/pull efforts for each of 4 contractions; one pop off) Episiotomy: None Lacerations: 2nd degree;Perineal and left periurethral Suture Repair: 3.0 vicryl rapide Est. Blood Loss (mL): 300  Mom to postpartum.  Baby to Couplet care / Skin to Skin.  Linda Hedges 03/23/2022, 9:06 PM

## 2021-09-10 ENCOUNTER — Telehealth: Payer: Self-pay | Admitting: Hematology and Oncology

## 2021-09-10 NOTE — Telephone Encounter (Signed)
Scheduled appt per 1/13 referral. Pt is aware of appt date and time. Pt is aware to arrive 15 mins prior to appt time.

## 2021-09-23 ENCOUNTER — Inpatient Hospital Stay: Payer: Managed Care, Other (non HMO)

## 2021-09-23 ENCOUNTER — Other Ambulatory Visit: Payer: Self-pay

## 2021-09-23 ENCOUNTER — Encounter: Payer: Self-pay | Admitting: Hematology and Oncology

## 2021-09-23 ENCOUNTER — Inpatient Hospital Stay: Payer: Managed Care, Other (non HMO) | Attending: Hematology and Oncology | Admitting: Hematology and Oncology

## 2021-09-23 VITALS — BP 131/80 | HR 95 | Temp 98.1°F | Resp 18 | Wt 166.0 lb

## 2021-09-23 DIAGNOSIS — O26893 Other specified pregnancy related conditions, third trimester: Secondary | ICD-10-CM | POA: Diagnosis present

## 2021-09-23 DIAGNOSIS — D582 Other hemoglobinopathies: Secondary | ICD-10-CM

## 2021-09-23 LAB — CBC WITH DIFFERENTIAL/PLATELET
Abs Immature Granulocytes: 0.05 10*3/uL (ref 0.00–0.07)
Basophils Absolute: 0.1 10*3/uL (ref 0.0–0.1)
Basophils Relative: 0 %
Eosinophils Absolute: 0.2 10*3/uL (ref 0.0–0.5)
Eosinophils Relative: 2 %
HCT: 34.8 % — ABNORMAL LOW (ref 36.0–46.0)
Hemoglobin: 12 g/dL (ref 12.0–15.0)
Immature Granulocytes: 0 %
Lymphocytes Relative: 18 %
Lymphs Abs: 2.1 10*3/uL (ref 0.7–4.0)
MCH: 30.2 pg (ref 26.0–34.0)
MCHC: 34.5 g/dL (ref 30.0–36.0)
MCV: 87.4 fL (ref 80.0–100.0)
Monocytes Absolute: 0.6 10*3/uL (ref 0.1–1.0)
Monocytes Relative: 5 %
Neutro Abs: 8.6 10*3/uL — ABNORMAL HIGH (ref 1.7–7.7)
Neutrophils Relative %: 75 %
Platelets: 301 10*3/uL (ref 150–400)
RBC: 3.98 MIL/uL (ref 3.87–5.11)
RDW: 12.9 % (ref 11.5–15.5)
WBC: 11.5 10*3/uL — ABNORMAL HIGH (ref 4.0–10.5)
nRBC: 0 % (ref 0.0–0.2)

## 2021-09-23 NOTE — Progress Notes (Signed)
[place] CONSULT NOTE  Patient Care Team: Lorrene Reid, PA-C as PCP - [facility], [facility]  CHIEF COMPLAINTS/PURPOSE OF CONSULTATION:  Abnormal hemoglobin electrophoresis  ASSESSMENT & PLAN:   Orders Placed This Encounter  Procedures   CBC with Differential/Platelet    Standing Status:   Standing    Number of Occurrences:   22    Standing Expiration Date:   09/23/2022   Hgb Fractionation Cascade    Standing Status:   Future    Number of Occurrences:   1    Standing Expiration Date:   09/23/2022   This is a very pleasant 26 year old female patient currently at 13 weeks of gestation referred to hematology for evaluation of abnormal hemoglobin electrophoresis.  Patient is doing very well, denies any major medical issues.  She had prenatal testing which showed abnormal hemoglobin electrophoresis and hence referred to hematology for further discussion.  No concerning physical examination findings.  I have reviewed her labs which showed slightly hemoglobin A greater than 90%, 94.6% specifically.  We will repeat CBC and hemoglobin fractionation today.  She does not have any evidence of microcytosis or hypochromia concerning for thalassemia trait.   If she does have thalassemia trait, we would recommend thalassemia testing for her husband as well. We may sometimes not be able to detect thalassemia trait on hemoglobin electrophoresis especially when its alpha thalassemia trait.  In this case she will need genetic testing.  I have discussed that if she were to have thalassemia trait and so does her husband, there is a 1 x 4 chance of her child having thalassemia major.  She expressed understanding of the recommendations.  She is okay with relaying her results via [place]. At this time she can return to hematology as needed.  HISTORY OF PRESENTING ILLNESS:  Amber Murillo 26 y.o. female is here because of abnormal hemoglobin electrophoresis.  This is a  very pleasant 26 year old female patient, currently 13 weeks of gestation with past medical history significant for GERD and attention deficit referred to hematology for evaluation of abnormal hemoglobin electrophoresis.  Patient is perfectly healthy at baseline, had some prenatal testing which showed abnormal hemoglobin electrophoresis testing and hence here for further evaluation.   She says the first trimester went well, some fatigue and morning sickness, otherwise doing well.  No prior pregnancies or miscarriages.  No family history of hematological disorders.  No Mediterranean ancestry.   Rest of the pertinent 10 point ROS reviewed and negative.  REVIEW OF SYSTEMS:   Constitutional: Denies fevers, chills or abnormal night sweats Eyes: Denies blurriness of vision, double vision or watery eyes Ears, nose, mouth, throat, and face: Denies mucositis or sore throat Respiratory: Denies cough, dyspnea or wheezes Cardiovascular: Denies palpitation, chest discomfort or lower extremity swelling Gastrointestinal:  Denies nausea, heartburn or change in bowel habits Skin: Denies abnormal skin rashes Lymphatics: Denies new lymphadenopathy or easy bruising Neurological:Denies numbness, tingling or new weaknesses Behavioral/Psych: Mood is stable, no new changes  All other systems were reviewed with the patient and are negative.  MEDICAL HISTORY:  Past Medical History:  Diagnosis Date   ADHD (attention deficit hyperactivity disorder)    GERD (gastroesophageal reflux disease)    Thyroid disease     SURGICAL HISTORY: No past surgical history on file.  SOCIAL HISTORY: Social History   Socioeconomic History   Marital status: Married    Spouse name: Not on file   Number of children: Not on file  Years of education: Not on file   Highest education level: Not on file  Occupational History   Not on file  Tobacco Use   Smoking status: Never   Smokeless tobacco: Never  Vaping Use   Vaping Use:  Never used  Substance and Sexual Activity   Alcohol use: No    Alcohol/week: 0.0 standard drinks   Drug use: No   Sexual activity: Yes    Birth control/protection: I.U.D.  Other Topics Concern   Not on file  Social History Narrative   Single.   No children.   Works as a Theme park manager.   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   Enjoys playing softball and riding horses.    Social Determinants of Health   Financial Resource Strain: Not on file  Food Insecurity: Not on file  Transportation Needs: Not on file  Physical Activity: Not on file  Stress: Not on file  Social Connections: Not on file  Intimate Partner Violence: Not on file    FAMILY HISTORY: Family History  Problem Relation Age of Onset   Hyperlipidemia Father    Hypothyroidism Maternal Grandmother    Hashimoto's thyroiditis Maternal Grandmother     ALLERGIES:  has No Known Allergies.  MEDICATIONS:  Current Outpatient Medications  Medication Sig Dispense Refill   EPINEPHrine 0.3 mg/0.3 mL IJ SOAJ injection Inject 0.3 mg into the muscle as needed for anaphylaxis. 1 each 1   levothyroxine (SYNTHROID) 75 MCG tablet Take 75 mcg by mouth daily.     Prenatal Vit-Fe Fumarate-FA (PRENATAL MULTIVITAMIN) TABS tablet Take 1 tablet by mouth daily at 12 noon.     No current facility-administered medications for this visit.     PHYSICAL EXAMINATION: ECOG PERFORMANCE STATUS: 0 - Asymptomatic  Vitals:   09/23/21 1418  BP: 131/80  Pulse: 95  Resp: 18  Temp: 98.1 F (36.7 C)  SpO2: 100%   Filed Weights   09/23/21 1418  Weight: 166 lb (75.3 kg)    GENERAL:alert, no distress and comfortable SKIN: skin color, texture, turgor are normal, no rashes or significant lesions EYES: normal, conjunctiva are pink and non-injected, sclera clear OROPHARYNX:no exudate, no erythema and lips, buccal mucosa, and tongue normal  NECK: supple, thyroid normal size, non-tender, without nodularity LYMPH:  no palpable lymphadenopathy in the  cervical, axillary LUNGS: clear to auscultation and percussion with normal breathing effort HEART: regular rate & rhythm and no murmurs and no lower extremity edema ABDOMEN:abdomen soft, non-tender and normal bowel sounds Musculoskeletal:no cyanosis of digits and no clubbing  PSYCH: alert & oriented x 3 with fluent speech NEURO: no focal motor/sensory deficits  LABORATORY DATA:  I have reviewed the data as listed Lab Results  Component Value Date   WBC 11.5 (H) 09/23/2021   HGB 12.0 09/23/2021   HCT 34.8 (L) 09/23/2021   MCV 87.4 09/23/2021   PLT 301 09/23/2021     Chemistry      Component Value Date/Time   NA 139 12/10/2020 0828   K 4.3 12/10/2020 0828   CL 100 12/10/2020 0828   CO2 24 12/10/2020 0828   BUN 14 12/10/2020 0828   CREATININE 0.83 12/10/2020 0828      Component Value Date/Time   CALCIUM 9.2 12/10/2020 0828   ALKPHOS 92 12/10/2020 0828   AST 17 12/10/2020 0828   ALT 19 12/10/2020 0828   BILITOT 0.3 12/10/2020 0828     I reviewed pertinent labs.    Hemoglobin A is 94.6%  RADIOGRAPHIC STUDIES:  I have personally reviewed the radiological images as listed and agreed with the findings in the report. No results found.  All questions were answered. The patient knows to call the clinic with any problems, questions or concerns. I spent 30 minutes in the care of this patient including H and P, review of records, counseling and coordination of care.     Benay Pike, MD 09/23/2021 4:54 PM

## 2021-09-27 ENCOUNTER — Other Ambulatory Visit: Payer: Self-pay | Admitting: Hematology and Oncology

## 2021-09-27 DIAGNOSIS — D582 Other hemoglobinopathies: Secondary | ICD-10-CM

## 2021-09-27 LAB — HGB FRACTIONATION CASCADE
Hgb A2: 2.7 % (ref 1.8–3.2)
Hgb A: 94.3 % — ABNORMAL LOW (ref 96.4–98.8)
Hgb F: 3 % — ABNORMAL HIGH (ref 0.0–2.0)
Hgb S: 0 %

## 2021-09-28 ENCOUNTER — Other Ambulatory Visit: Payer: Self-pay | Admitting: Adult Health

## 2021-09-28 ENCOUNTER — Inpatient Hospital Stay: Payer: Managed Care, Other (non HMO)

## 2021-09-28 ENCOUNTER — Other Ambulatory Visit: Payer: Self-pay

## 2021-09-28 DIAGNOSIS — D582 Other hemoglobinopathies: Secondary | ICD-10-CM | POA: Diagnosis not present

## 2021-09-28 DIAGNOSIS — D6489 Other specified anemias: Secondary | ICD-10-CM

## 2021-09-28 LAB — CBC WITH DIFFERENTIAL/PLATELET
Abs Immature Granulocytes: 0.03 10*3/uL (ref 0.00–0.07)
Basophils Absolute: 0 10*3/uL (ref 0.0–0.1)
Basophils Relative: 0 %
Eosinophils Absolute: 0.2 10*3/uL (ref 0.0–0.5)
Eosinophils Relative: 1 %
HCT: 36.2 % (ref 36.0–46.0)
Hemoglobin: 12.4 g/dL (ref 12.0–15.0)
Immature Granulocytes: 0 %
Lymphocytes Relative: 16 %
Lymphs Abs: 1.9 10*3/uL (ref 0.7–4.0)
MCH: 29.8 pg (ref 26.0–34.0)
MCHC: 34.3 g/dL (ref 30.0–36.0)
MCV: 87 fL (ref 80.0–100.0)
Monocytes Absolute: 0.6 10*3/uL (ref 0.1–1.0)
Monocytes Relative: 5 %
Neutro Abs: 8.8 10*3/uL — ABNORMAL HIGH (ref 1.7–7.7)
Neutrophils Relative %: 78 %
Platelets: 285 10*3/uL (ref 150–400)
RBC: 4.16 MIL/uL (ref 3.87–5.11)
RDW: 12.8 % (ref 11.5–15.5)
WBC: 11.4 10*3/uL — ABNORMAL HIGH (ref 4.0–10.5)
nRBC: 0 % (ref 0.0–0.2)

## 2021-09-30 ENCOUNTER — Other Ambulatory Visit: Payer: Self-pay | Admitting: *Deleted

## 2021-10-05 ENCOUNTER — Telehealth: Payer: Self-pay | Admitting: *Deleted

## 2021-10-05 LAB — ALPHA-THALASSEMIA GENOTYPR

## 2021-10-05 NOTE — Telephone Encounter (Signed)
This RN spoke with pt per MD review of labs with negative work up.  Discussed results and plan for follow up post delivery.  Questions answered.  LOS sent to schedule pt for appt in December 2023 note pt's delivery date expected is 03/30/2022.

## 2021-10-06 ENCOUNTER — Telehealth: Payer: Self-pay | Admitting: Hematology and Oncology

## 2021-10-06 NOTE — Telephone Encounter (Signed)
Sch per 2/7 inb, pt aware

## 2022-03-08 ENCOUNTER — Inpatient Hospital Stay (HOSPITAL_COMMUNITY)
Admission: AD | Admit: 2022-03-08 | Discharge: 2022-03-08 | Disposition: A | Discharge status: Home / Self Care | Payer: Managed Care, Other (non HMO) | Attending: Obstetrics and Gynecology | Admitting: Obstetrics and Gynecology

## 2022-03-08 ENCOUNTER — Encounter (HOSPITAL_COMMUNITY): Payer: Self-pay

## 2022-03-08 DIAGNOSIS — R200 Anesthesia of skin: Secondary | ICD-10-CM

## 2022-03-08 DIAGNOSIS — O26893 Other specified pregnancy related conditions, third trimester: Secondary | ICD-10-CM | POA: Diagnosis not present

## 2022-03-08 DIAGNOSIS — R030 Elevated blood-pressure reading, without diagnosis of hypertension: Secondary | ICD-10-CM | POA: Diagnosis not present

## 2022-03-08 DIAGNOSIS — R202 Paresthesia of skin: Secondary | ICD-10-CM | POA: Diagnosis not present

## 2022-03-08 DIAGNOSIS — I10 Essential (primary) hypertension: Secondary | ICD-10-CM | POA: Diagnosis not present

## 2022-03-08 DIAGNOSIS — Z3A36 36 weeks gestation of pregnancy: Secondary | ICD-10-CM

## 2022-03-08 LAB — COMPREHENSIVE METABOLIC PANEL
ALT: 11 U/L (ref 0–44)
AST: 17 U/L (ref 15–41)
Albumin: 2.4 g/dL — ABNORMAL LOW (ref 3.5–5.0)
Alkaline Phosphatase: 144 U/L — ABNORMAL HIGH (ref 38–126)
Anion gap: 8 (ref 5–15)
BUN: 10 mg/dL (ref 6–20)
CO2: 22 mmol/L (ref 22–32)
Calcium: 8.7 mg/dL — ABNORMAL LOW (ref 8.9–10.3)
Chloride: 107 mmol/L (ref 98–111)
Creatinine, Ser: 0.72 mg/dL (ref 0.44–1.00)
GFR, Estimated: >60 mL/min (ref >60)
Glucose, Bld: 79 mg/dL (ref 70–99)
Potassium: 3.7 mmol/L (ref 3.5–5.1)
Sodium: 137 mmol/L (ref 135–145)
Total Bilirubin: 0.7 mg/dL (ref 0.3–1.2)
Total Protein: 5.8 g/dL — ABNORMAL LOW (ref 6.5–8.1)

## 2022-03-08 LAB — CBC
HCT: 30.2 % — ABNORMAL LOW (ref 36.0–46.0)
Hemoglobin: 10.1 g/dL — ABNORMAL LOW (ref 12.0–15.0)
MCH: 28.1 pg (ref 26.0–34.0)
MCHC: 33.4 g/dL (ref 30.0–36.0)
MCV: 83.9 fL (ref 80.0–100.0)
Platelets: 273 10*3/uL (ref 150–400)
RBC: 3.6 MIL/uL — ABNORMAL LOW (ref 3.87–5.11)
RDW: 14.3 % (ref 11.5–15.5)
WBC: 13.5 10*3/uL — ABNORMAL HIGH (ref 4.0–10.5)
nRBC: 0 % (ref 0.0–0.2)

## 2022-03-08 LAB — URINALYSIS, ROUTINE W REFLEX MICROSCOPIC
Bilirubin Urine: NEGATIVE
Glucose, UA: NEGATIVE mg/dL
Hgb urine dipstick: NEGATIVE
Ketones, ur: NEGATIVE mg/dL
Leukocytes,Ua: NEGATIVE
Nitrite: NEGATIVE
Protein, ur: NEGATIVE mg/dL
Specific Gravity, Urine: 1.02 (ref 1.005–1.030)
pH: 6 (ref 5.0–8.0)

## 2022-03-08 LAB — PROTEIN / CREATININE RATIO, URINE
Creatinine, Urine: 97 mg/dL
Protein Creatinine Ratio: 0.2 mg/mg{Cre} — ABNORMAL HIGH (ref 0.00–0.15)
Total Protein, Urine: 19 mg/dL

## 2022-03-08 LAB — OB RESULTS CONSOLE GBS: GBS: NEGATIVE

## 2022-03-08 NOTE — MAU Provider Note (Signed)
Chief Complaint:  No chief complaint on file.   Event Date/Time   First Provider Initiated Contact with Patient 03/08/22 2029     HPI: Amber Murillo is a 26 y.o. G1P0 at 32w6dho presents to maternity admissions reporting tingling sensation in right calf which started tonight. States tingling is gone now.  Feet have been swollen this week and FOB thinks the right was more swollen than the left.  No pain behind calf or behind knee.  No history or hypertension. . She reports good fetal movement, denies LOF, vaginal bleeding, vaginal itching/burning, urinary symptoms, h/a, dizziness, n/v, diarrhea, constipation or fever/chills.  She denies headache, visual changes or RUQ abdominal pain.  Leg Pain  The incident occurred 1 to 3 hours ago. There was no injury mechanism. The pain is present in the right leg. Quality: Tingling. The pain is at a severity of 0/10. The patient is experiencing no pain. The pain has been Intermittent since onset. Associated symptoms include tingling. Pertinent negatives include no inability to bear weight, loss of motion, loss of sensation, muscle weakness or numbness. Nothing aggravates the symptoms. She has tried nothing for the symptoms.   RN Note: ANAHIARA KRETZSCHMARis a 26y.o. at 376w6dere in MAU reporting swelling in ankles, hands, feet for few wks. Tonight had tingling in R calf and husband stated R calf alittle larger than L. Tingling still there but runs down into foot. No pain. Tingling not as bad now but still there. Was seen at office this am and was 1cm. Good FM. No VB or LOF. Some abdominal tightening but no pain  Onset of complaint: tonight Pain score: 0  Past Medical History: Past Medical History:  Diagnosis Date   ADHD (attention deficit hyperactivity disorder)    GERD (gastroesophageal reflux disease)    Thyroid disease     Past obstetric history: OB History  Gravida Para Term Preterm AB Living  1            SAB IAB Ectopic Multiple Live Births                # Outcome Date GA Lbr Len/2nd Weight Sex Delivery Anes PTL Lv  1 Current             Past Surgical History: No past surgical history on file.  Family History: Family History  Problem Relation Age of Onset   Hyperlipidemia Father    Hypothyroidism Maternal Grandmother    Hashimoto's thyroiditis Maternal Grandmother     Social History: Social History   Tobacco Use   Smoking status: Never   Smokeless tobacco: Never  Vaping Use   Vaping Use: Never used  Substance Use Topics   Alcohol use: No    Alcohol/week: 0.0 standard drinks of alcohol   Drug use: No    Allergies: No Known Allergies  Meds:  Medications Prior to Admission  Medication Sig Dispense Refill Last Dose   cetirizine (ZYRTEC) 10 MG chewable tablet Chew 10 mg by mouth daily.   03/08/2022   levothyroxine (SYNTHROID) 75 MCG tablet Take 75 mcg by mouth daily.   03/08/2022   Prenatal Vit-Fe Fumarate-FA (PRENATAL MULTIVITAMIN) TABS tablet Take 1 tablet by mouth daily at 12 noon.   03/08/2022   EPINEPHrine 0.3 mg/0.3 mL IJ SOAJ injection Inject 0.3 mg into the muscle as needed for anaphylaxis. 1 each 1     I have reviewed patient's Past Medical Hx, Surgical Hx, Family Hx, Social Hx, medications and allergies.  ROS:  Review of Systems  Constitutional:  Negative for chills and fever.  Respiratory:  Negative for shortness of breath.   Cardiovascular:  Positive for leg swelling. Negative for chest pain.  Gastrointestinal:  Negative for abdominal pain.  Neurological:  Positive for tingling. Negative for weakness and numbness.       Tingling Right calf   Other systems negative  Physical Exam  Patient Vitals for the past 24 hrs:  BP Temp Pulse Resp SpO2 Height Weight  03/08/22 1940 140/81 -- 93 -- 99 % -- --  03/08/22 1939 -- 97.7 F (36.5 C) -- 17 -- 5' 1"  (1.549 m) 86.2 kg  132/88 128/83 130/87 133/75 133/77 131/81 Vitals:   03/08/22 2201 03/08/22 2206 03/08/22 2209 03/08/22 2210  BP: 127/80   135/86   Pulse: 90  90   Resp:    17  Temp:      SpO2: 98% 97%    Weight:      Height:        Constitutional: Well-developed, well-nourished female in no acute distress.  Cardiovascular: normal rate  Respiratory: normal effort GI: Abd soft, non-tender, gravid appropriate for gestational age.   No rebound or guarding. MS: Extremities nontender, 1+ bilateral pedal edema, normal ROM   NEGATIVE Homan's Sign bilaterally,  Neurologic: Alert and oriented x 4. DTRs 1+ GU: Neg CVAT.   FHT:  Baseline 140 , moderate variability, accelerations present, no decelerations Contractions:  Irregular     Labs: Results for orders placed or performed during the hospital encounter of 03/08/22 (from the past 24 hour(s))  Urinalysis, Routine w reflex microscopic Urine, Clean Catch     Status: None   Collection Time: 03/08/22  7:45 PM  Result Value Ref Range   Color, Urine YELLOW YELLOW   APPearance CLEAR CLEAR   Specific Gravity, Urine 1.020 1.005 - 1.030   pH 6.0 5.0 - 8.0   Glucose, UA NEGATIVE NEGATIVE mg/dL   Hgb urine dipstick NEGATIVE NEGATIVE   Bilirubin Urine NEGATIVE NEGATIVE   Ketones, ur NEGATIVE NEGATIVE mg/dL   Protein, ur NEGATIVE NEGATIVE mg/dL   Nitrite NEGATIVE NEGATIVE   Leukocytes,Ua NEGATIVE NEGATIVE  Protein / creatinine ratio, urine     Status: Abnormal   Collection Time: 03/08/22  7:56 PM  Result Value Ref Range   Creatinine, Urine 97 mg/dL   Total Protein, Urine 19 mg/dL   Protein Creatinine Ratio 0.20 (H) 0.00 - 0.15 mg/mg[Cre]  CBC     Status: Abnormal   Collection Time: 03/08/22  8:46 PM  Result Value Ref Range   WBC 13.5 (H) 4.0 - 10.5 K/uL   RBC 3.60 (L) 3.87 - 5.11 MIL/uL   Hemoglobin 10.1 (L) 12.0 - 15.0 g/dL   HCT 30.2 (L) 36.0 - 46.0 %   MCV 83.9 80.0 - 100.0 fL   MCH 28.1 26.0 - 34.0 pg   MCHC 33.4 30.0 - 36.0 g/dL   RDW 14.3 11.5 - 15.5 %   Platelets 273 150 - 400 K/uL   nRBC 0.0 0.0 - 0.2 %  Comprehensive metabolic panel     Status: Abnormal    Collection Time: 03/08/22  8:46 PM  Result Value Ref Range   Sodium 137 135 - 145 mmol/L   Potassium 3.7 3.5 - 5.1 mmol/L   Chloride 107 98 - 111 mmol/L   CO2 22 22 - 32 mmol/L   Glucose, Bld 79 70 - 99 mg/dL   BUN 10 6 - 20 mg/dL  Creatinine, Ser 0.72 0.44 - 1.00 mg/dL   Calcium 8.7 (L) 8.9 - 10.3 mg/dL   Total Protein 5.8 (L) 6.5 - 8.1 g/dL   Albumin 2.4 (L) 3.5 - 5.0 g/dL   AST 17 15 - 41 U/L   ALT 11 0 - 44 U/L   Alkaline Phosphatase 144 (H) 38 - 126 U/L   Total Bilirubin 0.7 0.3 - 1.2 mg/dL   GFR, Estimated >60 >60 mL/min   Anion gap 8 5 - 15    Imaging:  No results found.  MAU Course/MDM: I have reviewed the triage vital signs and the nursing notes.   Pertinent labs & imaging results that were available during my care of the patient were reviewed by me and considered in my medical decision making (see chart for details).      I have reviewed her medical records including past results, notes and treatments.   I have ordered labs and reviewed results. Labs ordered because of one elevated BP and borderline high-normal BPs.  These labs were normal  NST reviewed, reactive Discussed exam is not consistent with DVT.  Discussed tingling is likely paresthesia from complression in hip.  It had resolved by the time she got here.  Consult Dr Harolyn Rutherford  with presentation, exam findings and test results. She does not think there is an indication for a DVT ultrasound study.  Discussed isolated elevated BP may be a one-time thing, but it bears monitoring. Pt to notify provider and they will monitor BP.  Reviewed signs of Preeclampsia to return for and report Treatments in MAU included EFM.    Assessment: Single IUP at 3w6dRight leg tingling, resolved, likely Paresthesia Isolated sytolic hypertension, no sign of preeclampsia  Plan: Discharge home Come back if she develops pain or worsening swelling on leg Preeclampsia precautions Labor precautions and fetal kick counts Follow up in  Office for prenatal visits  Encouraged to return if she develops worsening of symptoms, increase in pain, fever, or other concerning symptoms.  Pt stable at time of discharge.  MHansel FeinsteinCNM, MSN Certified Nurse-Midwife 03/08/2022 8:29 PM

## 2022-03-08 NOTE — MAU Note (Addendum)
.  Amber Murillo is a 26 y.o. at 24w6dhere in MAU reporting swelling in ankles, hands, feet for few wks. Tonight had tingling in R calf and husband stated R calf alittle larger than L. Tingling still there but runs down into foot. No pain. Tingling not as bad now but still there. Was seen at office this am and was 1cm. Good FM. No VB or LOF. Some abdominal tightening but no pain  Onset of complaint: tonight Pain score: 0 Vitals:   03/08/22 1939 03/08/22 1940  BP:  140/81  Pulse:  93  Resp: 17   Temp: 97.7 F (36.5 C)   SpO2:  99%     FHT:140 Lab orders placed from triage:  uri

## 2022-03-08 NOTE — Discharge Instructions (Addendum)
Only one elevated BP at 140/81

## 2022-03-22 ENCOUNTER — Other Ambulatory Visit: Payer: Self-pay

## 2022-03-22 ENCOUNTER — Telehealth (HOSPITAL_COMMUNITY): Payer: Self-pay | Admitting: *Deleted

## 2022-03-22 NOTE — Telephone Encounter (Signed)
Preadmission screen  

## 2022-03-23 ENCOUNTER — Inpatient Hospital Stay (HOSPITAL_COMMUNITY)
Admission: AD | Admit: 2022-03-23 | Discharge: 2022-03-25 | DRG: 807 | Disposition: A | Discharge status: Home / Self Care | Payer: Managed Care, Other (non HMO) | Attending: Obstetrics & Gynecology | Admitting: Obstetrics & Gynecology

## 2022-03-23 ENCOUNTER — Inpatient Hospital Stay (HOSPITAL_COMMUNITY): Payer: Managed Care, Other (non HMO) | Admitting: Anesthesiology

## 2022-03-23 ENCOUNTER — Inpatient Hospital Stay (HOSPITAL_COMMUNITY): Payer: Managed Care, Other (non HMO)

## 2022-03-23 DIAGNOSIS — O134 Gestational [pregnancy-induced] hypertension without significant proteinuria, complicating childbirth: Secondary | ICD-10-CM | POA: Diagnosis present

## 2022-03-23 DIAGNOSIS — E039 Hypothyroidism, unspecified: Secondary | ICD-10-CM | POA: Diagnosis present

## 2022-03-23 DIAGNOSIS — Z3490 Encounter for supervision of normal pregnancy, unspecified, unspecified trimester: Principal | ICD-10-CM

## 2022-03-23 DIAGNOSIS — Z3A39 39 weeks gestation of pregnancy: Secondary | ICD-10-CM | POA: Diagnosis not present

## 2022-03-23 DIAGNOSIS — O99284 Endocrine, nutritional and metabolic diseases complicating childbirth: Secondary | ICD-10-CM | POA: Diagnosis present

## 2022-03-23 DIAGNOSIS — O139 Gestational [pregnancy-induced] hypertension without significant proteinuria, unspecified trimester: Secondary | ICD-10-CM | POA: Diagnosis present

## 2022-03-23 LAB — CBC
HCT: 30.4 % — ABNORMAL LOW (ref 36.0–46.0)
HCT: 33.3 % — ABNORMAL LOW (ref 36.0–46.0)
HCT: 29.4 % — ABNORMAL LOW (ref 36.0–46.0)
Hemoglobin: 10.1 g/dL — ABNORMAL LOW (ref 12.0–15.0)
Hemoglobin: 10.8 g/dL — ABNORMAL LOW (ref 12.0–15.0)
Hemoglobin: 9.8 g/dL — ABNORMAL LOW (ref 12.0–15.0)
MCH: 27.6 pg (ref 26.0–34.0)
MCH: 27.3 pg (ref 26.0–34.0)
MCH: 27.7 pg (ref 26.0–34.0)
MCHC: 33.2 g/dL (ref 30.0–36.0)
MCHC: 32.4 g/dL (ref 30.0–36.0)
MCHC: 33.3 g/dL (ref 30.0–36.0)
MCV: 83.1 fL (ref 80.0–100.0)
MCV: 84.3 fL (ref 80.0–100.0)
MCV: 83.1 fL (ref 80.0–100.0)
Platelets: 315 10*3/uL (ref 150–400)
Platelets: 332 10*3/uL (ref 150–400)
Platelets: 273 10*3/uL (ref 150–400)
RBC: 3.66 MIL/uL — ABNORMAL LOW (ref 3.87–5.11)
RBC: 3.95 MIL/uL (ref 3.87–5.11)
RBC: 3.54 MIL/uL — ABNORMAL LOW (ref 3.87–5.11)
RDW: 14.8 % (ref 11.5–15.5)
RDW: 14.9 % (ref 11.5–15.5)
RDW: 15.2 % (ref 11.5–15.5)
WBC: 12 10*3/uL — ABNORMAL HIGH (ref 4.0–10.5)
WBC: 13.2 10*3/uL — ABNORMAL HIGH (ref 4.0–10.5)
WBC: 21.4 10*3/uL — ABNORMAL HIGH (ref 4.0–10.5)
nRBC: 0 % (ref 0.0–0.2)
nRBC: 0 % (ref 0.0–0.2)
nRBC: 0 % (ref 0.0–0.2)

## 2022-03-23 LAB — COMPREHENSIVE METABOLIC PANEL
ALT: 9 U/L (ref 0–44)
AST: 16 U/L (ref 15–41)
Albumin: 2.4 g/dL — ABNORMAL LOW (ref 3.5–5.0)
Alkaline Phosphatase: 158 U/L — ABNORMAL HIGH (ref 38–126)
Anion gap: 7 (ref 5–15)
BUN: 10 mg/dL (ref 6–20)
CO2: 21 mmol/L — ABNORMAL LOW (ref 22–32)
Calcium: 8.9 mg/dL (ref 8.9–10.3)
Chloride: 108 mmol/L (ref 98–111)
Creatinine, Ser: 0.78 mg/dL (ref 0.44–1.00)
GFR, Estimated: >60 mL/min (ref >60)
Glucose, Bld: 95 mg/dL (ref 70–99)
Potassium: 3.9 mmol/L (ref 3.5–5.1)
Sodium: 136 mmol/L (ref 135–145)
Total Bilirubin: 0.2 mg/dL — ABNORMAL LOW (ref 0.3–1.2)
Total Protein: 5.9 g/dL — ABNORMAL LOW (ref 6.5–8.1)

## 2022-03-23 LAB — TYPE AND SCREEN
ABO/RH(D): O POS
Antibody Screen: NEGATIVE

## 2022-03-23 LAB — RPR: RPR Ser Ql: NONREACTIVE

## 2022-03-23 MED ORDER — WITCH HAZEL-GLYCERIN EX PADS: 1.0000 | MEDICATED_PAD | CUTANEOUS | Status: DC | PRN

## 2022-03-23 MED ORDER — PHENYLEPHRINE 80 MCG/ML (10ML) SYRINGE FOR IV PUSH (FOR BLOOD PRESSURE SUPPORT): 80.0000 ug | PREFILLED_SYRINGE | INTRAVENOUS | Status: DC | PRN

## 2022-03-23 MED ORDER — LACTATED RINGERS IV SOLN: 500.0000 mL | Freq: Once | INTRAVENOUS | Status: DC

## 2022-03-23 MED ORDER — LEVOTHYROXINE SODIUM 100 MCG PO TABS
100.0000 ug | ORAL_TABLET | Freq: Every day | ORAL | Status: DC
  Administered 2022-03-24 – 2022-03-25 (×2): 100 ug via ORAL
  Filled 2022-03-23 (×2): qty 1

## 2022-03-23 MED ORDER — ONDANSETRON HCL 4 MG/2ML IJ SOLN: 4.0000 mg | Freq: Four times a day (QID) | INTRAMUSCULAR | Status: DC | PRN

## 2022-03-23 MED ORDER — BENZOCAINE-MENTHOL 20-0.5 % EX AERO
1.0000 | INHALATION_SPRAY | CUTANEOUS | Status: DC | PRN
  Administered 2022-03-24: 1 via TOPICAL
  Filled 2022-03-23: qty 56

## 2022-03-23 MED ORDER — OXYTOCIN-SODIUM CHLORIDE 30-0.9 UT/500ML-% IV SOLN
1.0000 m[IU]/min | INTRAVENOUS | Status: DC
  Administered 2022-03-23: 2 m[IU]/min via INTRAVENOUS

## 2022-03-23 MED ORDER — EPHEDRINE 5 MG/ML INJ: 10.0000 mg | INTRAVENOUS | Status: DC | PRN

## 2022-03-23 MED ORDER — IBUPROFEN 600 MG PO TABS
600.0000 mg | ORAL_TABLET | Freq: Four times a day (QID) | ORAL | Status: DC
  Administered 2022-03-23 – 2022-03-25 (×6): 600 mg via ORAL
  Filled 2022-03-23 (×6): qty 1

## 2022-03-23 MED ORDER — LACTATED RINGERS IV SOLN
500.0000 mL | INTRAVENOUS | Status: DC | PRN
  Administered 2022-03-23: 500 mL via INTRAVENOUS

## 2022-03-23 MED ORDER — FENTANYL-BUPIVACAINE-NACL 0.5-0.125-0.9 MG/250ML-% EP SOLN
12.0000 mL/h | EPIDURAL | Status: DC | PRN
  Administered 2022-03-23: 12 mL/h via EPIDURAL
  Filled 2022-03-23: qty 250

## 2022-03-23 MED ORDER — OXYTOCIN BOLUS FROM INFUSION
333.0000 mL | Freq: Once | INTRAVENOUS | Status: AC
Start: 2022-03-23 — End: 2022-03-23
  Administered 2022-03-23: 333 mL via INTRAVENOUS

## 2022-03-23 MED ORDER — PHENYLEPHRINE 80 MCG/ML (10ML) SYRINGE FOR IV PUSH (FOR BLOOD PRESSURE SUPPORT)
80.0000 ug | PREFILLED_SYRINGE | INTRAVENOUS | Status: DC | PRN
  Filled 2022-03-23: qty 10

## 2022-03-23 MED ORDER — ZOLPIDEM TARTRATE 5 MG PO TABS: 5.0000 mg | ORAL_TABLET | Freq: Every evening | ORAL | Status: DC | PRN

## 2022-03-23 MED ORDER — SOD CITRATE-CITRIC ACID 500-334 MG/5ML PO SOLN: 30.0000 mL | ORAL | Status: DC | PRN

## 2022-03-23 MED ORDER — LACTATED RINGERS IV SOLN
INTRAVENOUS | Status: DC
Start: 2022-03-23 — End: 2022-03-23

## 2022-03-23 MED ORDER — DIBUCAINE (PERIANAL) 1 % EX OINT: 1.0000 | TOPICAL_OINTMENT | CUTANEOUS | Status: DC | PRN

## 2022-03-23 MED ORDER — COCONUT OIL OIL: 1.0000 | TOPICAL_OIL | Status: DC | PRN

## 2022-03-23 MED ORDER — MISOPROSTOL 25 MCG QUARTER TABLET
25.0000 ug | ORAL_TABLET | ORAL | Status: DC | PRN
  Administered 2022-03-23: 25 ug via VAGINAL
  Filled 2022-03-23 (×2): qty 1

## 2022-03-23 MED ORDER — OXYCODONE-ACETAMINOPHEN 5-325 MG PO TABS: 1.0000 | ORAL_TABLET | ORAL | Status: DC | PRN

## 2022-03-23 MED ORDER — PRENATAL MULTIVITAMIN CH
1.0000 | ORAL_TABLET | Freq: Every day | ORAL | Status: DC
  Administered 2022-03-24: 1 via ORAL
  Filled 2022-03-23: qty 1

## 2022-03-23 MED ORDER — LIDOCAINE HCL (PF) 1 % IJ SOLN
30.0000 mL | INTRAMUSCULAR | Status: AC | PRN
  Administered 2022-03-23: 30 mL via SUBCUTANEOUS
  Filled 2022-03-23: qty 30

## 2022-03-23 MED ORDER — DIPHENHYDRAMINE HCL 50 MG/ML IJ SOLN: 12.5000 mg | INTRAMUSCULAR | Status: DC | PRN

## 2022-03-23 MED ORDER — ACETAMINOPHEN 325 MG PO TABS
650.0000 mg | ORAL_TABLET | ORAL | Status: DC | PRN
  Filled 2022-03-23: qty 2

## 2022-03-23 MED ORDER — SIMETHICONE 80 MG PO CHEW: 80.0000 mg | CHEWABLE_TABLET | ORAL | Status: DC | PRN

## 2022-03-23 MED ORDER — ACETAMINOPHEN 325 MG PO TABS
650.0000 mg | ORAL_TABLET | ORAL | Status: DC | PRN
  Administered 2022-03-23: 650 mg via ORAL
  Filled 2022-03-23: qty 2

## 2022-03-23 MED ORDER — TERBUTALINE SULFATE 1 MG/ML IJ SOLN: 0.2500 mg | Freq: Once | INTRAMUSCULAR | Status: DC | PRN

## 2022-03-23 MED ORDER — LEVOTHYROXINE SODIUM 100 MCG PO TABS
100.0000 ug | ORAL_TABLET | Freq: Every day | ORAL | Status: DC
  Administered 2022-03-23: 100 ug via ORAL
  Filled 2022-03-23 (×2): qty 1

## 2022-03-23 MED ORDER — OXYTOCIN-SODIUM CHLORIDE 30-0.9 UT/500ML-% IV SOLN
2.5000 [IU]/h | INTRAVENOUS | Status: DC
  Administered 2022-03-23: 2.5 [IU]/h via INTRAVENOUS
  Filled 2022-03-23: qty 500

## 2022-03-23 MED ORDER — TETANUS-DIPHTH-ACELL PERTUSSIS 5-2.5-18.5 LF-MCG/0.5 IM SUSY: 0.5000 mL | PREFILLED_SYRINGE | Freq: Once | INTRAMUSCULAR | Status: DC

## 2022-03-23 MED ORDER — DIPHENHYDRAMINE HCL 25 MG PO CAPS: 25.0000 mg | ORAL_CAPSULE | Freq: Four times a day (QID) | ORAL | Status: DC | PRN

## 2022-03-23 MED ORDER — ONDANSETRON HCL 4 MG/2ML IJ SOLN: 4.0000 mg | INTRAMUSCULAR | Status: DC | PRN

## 2022-03-23 MED ORDER — OXYCODONE-ACETAMINOPHEN 5-325 MG PO TABS: 2.0000 | ORAL_TABLET | ORAL | Status: DC | PRN

## 2022-03-23 MED ORDER — FENTANYL CITRATE (PF) 100 MCG/2ML IJ SOLN
50.0000 ug | INTRAMUSCULAR | Status: DC | PRN
  Administered 2022-03-23 (×3): 100 ug via INTRAVENOUS
  Filled 2022-03-23 (×3): qty 2

## 2022-03-23 MED ORDER — LIDOCAINE-EPINEPHRINE (PF) 2 %-1:200000 IJ SOLN
INTRAMUSCULAR | Status: DC | PRN
  Administered 2022-03-23: 5 mL via EPIDURAL

## 2022-03-23 MED ORDER — SENNOSIDES-DOCUSATE SODIUM 8.6-50 MG PO TABS
2.0000 | ORAL_TABLET | Freq: Every day | ORAL | Status: DC
Start: 2022-03-24 — End: 2022-03-25
  Administered 2022-03-24: 2 via ORAL
  Filled 2022-03-23: qty 2

## 2022-03-23 MED ORDER — ONDANSETRON HCL 4 MG PO TABS: 4.0000 mg | ORAL_TABLET | ORAL | Status: DC | PRN

## 2022-03-23 NOTE — Plan of Care (Signed)

## 2022-03-23 NOTE — Progress Notes (Signed)
Amber Murillo is a 26 y.o. G1P0 at 73w0dby ultrasound admitted for induction of labor due to [facility]  Subjective: Comfortable with CLEA; patient thinks water broke around time of last check (0900)  Objective: BP 135/79   Pulse 66   Temp 97.6 F (36.4 C) (Axillary)   Resp 16   Ht 5' 1"  (1.549 m)   Wt 87 kg   SpO2 98%   BMI 36.24 kg/m  No intake/output data recorded. No intake/output data recorded.  FHT:  FHR: 130 bpm, variability: moderate,  accelerations:  Present,  decelerations:  Absent UC:   regular, every 2 minutes SVE:   Dilation: 5 Effacement (%): 70 Station: -2 Exam by:: Dr. MLynnette CaffeyNo bag palpated  Labs: Lab Results  Component Value Date   WBC 13.2 (H) 03/23/2022   HGB 10.8 (L) 03/23/2022   HCT 33.3 (L) 03/23/2022   MCV 84.3 03/23/2022   PLT 332 03/23/2022    Assessment / Plan: Induction of labor progressing well s/p VMP x 1 dose  Labor: Progressing normally Preeclampsia:   n/a Fetal Wellbeing:  Category I Pain Control:  Epidural I/D:  n/a Anticipated MOD:  NSVD  [name], DO 03/23/2022, 10:12 AM

## 2022-03-23 NOTE — H&P (Signed)
Amber Murillo is a 26 y.o. female G1 at 7w0dpresenting [facility]  No HA, vision change, RUQ pain, CP/SOB.  S/P misoprostol at 0055; just started feeling intense CTX.  Received fentanyl x 1 and now requesting CLEA.  Antepartum course complicated by hypothyroidism controlled on synthroid 100 mcg.  Last u/s 6/21 and EFW 5#13 (82%).  GBS negative.    OB History     Gravida  1   Para      Term      Preterm      AB      Living         SAB      IAB      Ectopic      Multiple      Live Births             Past Medical History:  Diagnosis Date   ADHD (attention deficit hyperactivity disorder)    GERD (gastroesophageal reflux disease)    Thyroid disease    No past surgical history on file. Family History: family history includes Hashimoto's thyroiditis in her maternal grandmother; Hyperlipidemia in her father; Hypothyroidism in her maternal grandmother. Social History:  reports that she has never smoked. She has never used smokeless tobacco. She reports that she does not drink alcohol and does not use drugs.     Maternal Diabetes: No Genetic Screening: Normal Maternal Ultrasounds/Referrals: Normal Fetal Ultrasounds or other Referrals:  None Maternal Substance Abuse:  No Significant Maternal Medications:  Meds include: Syntroid Significant Maternal Lab Results:  Group B Strep negative Number of Prenatal Visits:greater than 3 verified prenatal visits Other Comments:  None  Review of Systems Maternal Medical History:  Fetal activity: Perceived fetal activity is normal.   Last perceived fetal movement was within the past hour.   Prenatal complications: PIH.   Prenatal Complications - Diabetes: none.   Dilation: 1 Effacement (%): 60 Station: -3 Exam by:: CEual Fines RN Blood pressure (!) 143/90, pulse 86, temperature 98.1 F (36.7 C), temperature source Oral, resp. rate 18, height 5' 1"  (1.549 m), weight 87 kg, SpO2 100 %. Maternal  Exam:  Uterine Assessment: Contraction strength is moderate.  Contraction frequency is regular.  Abdomen: Patient reports no abdominal tenderness. Fundal height is c/w dates.   Estimated fetal weight is 8#.     Fetal Exam Fetal Monitor Review: Baseline rate: 130.  Variability: moderate (6-25 bpm).   Pattern: accelerations present and no decelerations.   Fetal State Assessment: Category I - tracings are normal.   Physical Exam Constitutional:      Appearance: Normal appearance.  HENT:     Head: Normocephalic and atraumatic.  Pulmonary:     Effort: Pulmonary effort is normal.  Abdominal:     Palpations: Abdomen is soft.  Musculoskeletal:        General: Normal range of motion.     Cervical back: Normal range of motion.  Skin:    General: Skin is warm and dry.  Neurological:     Mental Status: She is alert and oriented to person, place, and time.  Psychiatric:        Mood and Affect: Mood normal.        Behavior: Behavior normal.     Prenatal labs: ABO, Rh: --/--/O POS (07/26 0015) Antibody: NEG (07/26 0015) Rubella: Immune (12/29 0000) RPR: Nonreactive (12/29 0000)  HBsAg: Negative (12/29 0000)  HIV: Non-reactive (12/29 0000)  GBS: Negative/-- (07/11 0000)   Assessment/Plan:  25yo G1 at 70w0dfor IOL secondary to GHTN -GHTN-normal to mild range BPs; normal labs.  Magnesium sulfate for severe range BPs or symptoms. -CLEA now -Recheck cervix after comfortable -Anticipate NSVD   MLinda Hedges7/26/2023, 7:53 AM

## 2022-03-23 NOTE — Progress Notes (Signed)
Amber Murillo is a 26 y.o. G1P0 at 18w0dby ultrasound [facility]  Subjective: Feeling some rectal pressure  Objective: BP 128/72   Pulse (!) 101   Temp 99 F (37.2 C) (Axillary)   Resp 18   Ht 5' 1"  (1.549 m)   Wt 87 kg   SpO2 98%   BMI 36.24 kg/m  No intake/output data recorded. No intake/output data recorded.  FHT:  FHR: 140 bpm, variability: moderate,  accelerations:  Abscent,  decelerations:  Present early UC:   regular, every 2-3 minutes SVE:   Dilation: 8 Effacement (%): 100 Station: Plus 1 Exam by:: Dr. MLynnette CaffeyIUPC placed  Labs: Lab Results  Component Value Date   WBC 13.2 (H) 03/23/2022   HGB 10.8 (L) 03/23/2022   HCT 33.3 (L) 03/23/2022   MCV 84.3 03/23/2022   PLT 332 03/23/2022    Assessment / Plan: [facility]  Labor:  IUPC placed and will monitor MVUs on pitocin Preeclampsia:   n/a Fetal Wellbeing:  Category I Pain Control:  Epidural I/D:  n/a Anticipated MOD:  NSVD  MLinda Hedges DO 03/23/2022, 5:54 PM

## 2022-03-23 NOTE — Lactation Note (Signed)
This note was copied from a baby's chart. Lactation Consultation Note  Patient Name: Amber Murillo IWLNL'G Date: 03/23/2022 Reason for consult: L&D Initial assessment Age:28 hours Birth Parent latched infant on the right breast using the football hold position, infant was on and off breast, breastfeeding for 15 minutes. Birth Parent will continue to work on latching infant at the breast and will ask RN/LC for further latch assistance if needed on MBU. Birth Parent will continue to BF infant according to hunger cues, on demand, 8 to 12+ times within 24 hours, STS. LC discussed the importance of Maternal rest, diet and hydration for Birth Parent. [place] congratulated parents on the birth of their son. Maternal Data    Feeding Mother's Current Feeding Choice: Breast Milk and Formula (Birth parent informed LC that her current feeding choice is breast and formula feeding in L&D.)  LATCH Score Latch: Repeated attempts needed to sustain latch, nipple held in mouth throughout feeding, stimulation needed to elicit sucking reflex.  Audible Swallowing: A few with stimulation  Type of Nipple: Everted at rest and after stimulation  Comfort (Breast/Nipple): Soft / non-tender  Hold (Positioning): Assistance needed to correctly position infant at breast and maintain latch.  LATCH Score: 7   Lactation Tools Discussed/Used    Interventions Interventions: Assisted with latch;Skin to skin;Breast compression;Adjust position;Support pillows;Position options;Education  Discharge    Consult Status Consult Status: Follow-up from L&D    Vicente Serene 03/23/2022, 9:52 PM

## 2022-03-23 NOTE — Anesthesia Preprocedure Evaluation (Addendum)
Anesthesia Evaluation  Patient identified by MRN, date of birth, ID band Patient awake    Reviewed: Allergy & Precautions, NPO status , Patient's Chart, lab work & pertinent test results  Airway Mallampati: II  TM Distance: >3 FB Neck ROM: Full    Dental no notable dental hx. (+) Teeth Intact, Dental Advisory Given   Pulmonary neg pulmonary ROS,    Pulmonary exam normal breath sounds clear to auscultation       Cardiovascular negative cardio ROS Normal cardiovascular exam Rhythm:Regular Rate:Normal     Neuro/Psych PSYCHIATRIC DISORDERS Anxiety negative neurological ROS     GI/Hepatic Neg liver ROS, GERD  ,  Endo/Other  Hypothyroidism   Renal/GU negative Renal ROS  negative genitourinary   Musculoskeletal  (+) Arthritis ,   Abdominal   Peds  Hematology  (+) Blood dyscrasia, anemia ,   Anesthesia Other Findings   Reproductive/Obstetrics (+) Pregnancy                             Anesthesia Physical Anesthesia Plan  ASA: 2  Anesthesia Plan: Epidural   Post-op Pain Management:    Induction:   PONV Risk Score and Plan: Treatment may vary due to age or medical condition  Airway Management Planned: Natural Airway  Additional Equipment:   Intra-op Plan:   Post-operative Plan:   Informed Consent: I have reviewed the patients History and Physical, chart, labs and discussed the procedure including the risks, benefits and alternatives for the proposed anesthesia with the patient or authorized representative who has indicated his/her understanding and acceptance.       Plan Discussed with: Anesthesiologist  Anesthesia Plan Comments: (Patient identified. Risks, benefits, options discussed with patient including but not limited to bleeding, infection, nerve damage, paralysis, failed block, incomplete pain control, headache, blood pressure changes, nausea, vomiting, reactions to  medication, itching, and post partum back pain. Confirmed with bedside nurse the patient's most recent platelet count. Confirmed with the patient that they are not taking any anticoagulation, have any bleeding history or any family history of bleeding disorders. Patient expressed understanding and wishes to proceed. All questions were answered. )        Anesthesia Quick Evaluation

## 2022-03-23 NOTE — Anesthesia Procedure Notes (Signed)
Epidural Patient location during procedure: OB Start time: 03/23/2022 9:20 AM End time: 03/23/2022 9:30 AM  Staffing Anesthesiologist: Freddrick March, MD Performed: anesthesiologist   Preanesthetic Checklist Completed: patient identified, IV checked, risks and benefits discussed, monitors and equipment checked, pre-op evaluation and timeout performed  Epidural Patient position: sitting Prep: DuraPrep and site prepped and draped Patient monitoring: continuous pulse ox, blood pressure, heart rate and cardiac monitor Approach: midline Location: L3-L4 Injection technique: LOR air  Needle:  Needle type: Tuohy  Needle gauge: 17 G Needle length: 9 cm Needle insertion depth: 5 cm Catheter type: closed end flexible Catheter size: 19 Gauge Catheter at skin depth: 10 cm Test dose: negative  Assessment Sensory level: T8 Events: blood not aspirated, injection not painful, no injection resistance, no paresthesia and negative IV test  Additional Notes Patient identified. Risks/Benefits/Options discussed with patient including but not limited to bleeding, infection, nerve damage, paralysis, failed block, incomplete pain control, headache, blood pressure changes, nausea, vomiting, reactions to medication both or allergic, itching and postpartum back pain. Confirmed with bedside nurse the patient's most recent platelet count. Confirmed with patient that they are not currently taking any anticoagulation, have any bleeding history or any family history of bleeding disorders. Patient expressed understanding and wished to proceed. All questions were answered. Sterile technique was used throughout the entire procedure. Please see nursing notes for vital signs. Test dose was given through epidural catheter and negative prior to continuing to dose epidural or start infusion. Warning signs of high block given to the patient including shortness of breath, tingling/numbness in hands, complete motor block,  or any concerning symptoms with instructions to call for help. Patient was given instructions on fall risk and not to get out of bed. All questions and concerns addressed with instructions to call with any issues or inadequate analgesia.  Reason for block:procedure for pain

## 2022-03-24 ENCOUNTER — Encounter (HOSPITAL_COMMUNITY): Payer: Self-pay | Admitting: Obstetrics & Gynecology

## 2022-03-24 ENCOUNTER — Inpatient Hospital Stay (HOSPITAL_COMMUNITY): Payer: Managed Care, Other (non HMO)

## 2022-03-24 LAB — CBC
HCT: 26.8 % — ABNORMAL LOW (ref 36.0–46.0)
Hemoglobin: 8.8 g/dL — ABNORMAL LOW (ref 12.0–15.0)
MCH: 27.5 pg (ref 26.0–34.0)
MCHC: 32.8 g/dL (ref 30.0–36.0)
MCV: 83.8 fL (ref 80.0–100.0)
Platelets: 269 10*3/uL (ref 150–400)
RBC: 3.2 MIL/uL — ABNORMAL LOW (ref 3.87–5.11)
RDW: 15.3 % (ref 11.5–15.5)
WBC: 18 10*3/uL — ABNORMAL HIGH (ref 4.0–10.5)
nRBC: 0 % (ref 0.0–0.2)

## 2022-03-24 LAB — COMPREHENSIVE METABOLIC PANEL
ALT: 12 U/L (ref 0–44)
AST: 23 U/L (ref 15–41)
Albumin: 1.8 g/dL — ABNORMAL LOW (ref 3.5–5.0)
Alkaline Phosphatase: 188 U/L — ABNORMAL HIGH (ref 38–126)
Anion gap: 4 — ABNORMAL LOW (ref 5–15)
BUN: 10 mg/dL (ref 6–20)
CO2: 23 mmol/L (ref 22–32)
Calcium: 8.1 mg/dL — ABNORMAL LOW (ref 8.9–10.3)
Chloride: 109 mmol/L (ref 98–111)
Creatinine, Ser: 0.9 mg/dL (ref 0.44–1.00)
GFR, Estimated: >60 mL/min (ref >60)
Glucose, Bld: 91 mg/dL (ref 70–99)
Potassium: 3.9 mmol/L (ref 3.5–5.1)
Sodium: 136 mmol/L (ref 135–145)
Total Bilirubin: 0.7 mg/dL (ref 0.3–1.2)
Total Protein: 4.6 g/dL — ABNORMAL LOW (ref 6.5–8.1)

## 2022-03-24 NOTE — Anesthesia Postprocedure Evaluation (Signed)
Anesthesia Post Note  Patient: Amber Murillo  Procedure(s) Performed: AN AD [place]     Patient location during evaluation: Mother Baby Anesthesia Type: Epidural Level of consciousness: awake and alert Pain management: pain level controlled Vital Signs Assessment: post-procedure vital signs reviewed and stable Respiratory status: spontaneous breathing, nonlabored ventilation and respiratory function stable Cardiovascular status: stable Postop Assessment: no headache, no backache and epidural receding Anesthetic complications: no   No notable events documented.  Last Vitals:  Vitals:   03/24/22 0011 03/24/22 0445  BP: (!) 142/86 126/76  Pulse: 86 80  Resp: 17 18  Temp: 36.5 C 36.7 C  SpO2: 100% 100%    Last Pain:  Vitals:   03/24/22 0445  TempSrc: Oral  PainSc:    Pain Goal:                   [name]

## 2022-03-24 NOTE — Lactation Note (Signed)
This note was copied from a baby's chart. Lactation Consultation Note  Patient Name: Boy Viriginia Amendola [id number] Date: 03/24/2022 Reason for consult: Initial assessment;Primapara;Term Age:28 hours  Baby hasn't fed since last night. Mom has attempted.  LC unswaddled baby and placed baby STS.  Cues noted, mom latched baby with assistance in cross cradle.  Mom shown a few ways to support her breast without causes flattening of the nipple/breast tissue. Once latch, baby sustained latch well, swallows were heard with stimulation and compression. AFter feeding, nipple was rounded. Mom latched independently on the right side in football hold.  Several questions from family were addressed. Manual pump size 24 flange used.  Encouraged feeding with cues, 8-12 feeds/24 hours, hand expression, hand pump prior to latching if needed. Mom has resource sheet, encouraged follow up with [place].  Maternal Data Has patient been taught Hand Expression?: Yes Does the patient have breastfeeding experience prior to this delivery?: No  Feeding Mother's Current Feeding Choice: Breast Milk  LATCH Score Latch: Repeated attempts needed to sustain latch, nipple held in mouth throughout feeding, stimulation needed to elicit sucking reflex.  Audible Swallowing: A few with stimulation  Type of Nipple: Flat (everts wtih hand expression and latching, compressible tissue, flattens with compression)  Comfort (Breast/Nipple): Soft / non-tender  Hold (Positioning): Assistance needed to correctly position infant at breast and maintain latch.  LATCH Score: 6   Lactation Tools Discussed/Used Tools: Pump Breast pump type: Manual Reason for Pumping: evert tissue, pre pump  Interventions Interventions: Breast feeding basics reviewed;Assisted with latch;Skin to skin;Adjust position;Breast compression;LC Services brochure;Hand pump  Discharge Pump: Manual;Personal  Consult Status Consult Status:  Follow-up Date: 03/25/22 Follow-up type: In-patient    Ferne Coe [facility] 03/24/2022, 9:33 AM

## 2022-03-24 NOTE — Progress Notes (Signed)
Post Partum Day 1 Subjective: no complaints, up ad lib, voiding, and tolerating PO  Objective: Blood pressure 126/76, pulse 80, temperature 98 F (36.7 C), temperature source Oral, resp. rate 18, height 5' 1"  (1.549 m), weight 87 kg, SpO2 100 %.  Physical Exam:  General: alert Lochia: appropriate Uterine Fundus: firm Incision: N/A DVT Evaluation: No evidence of DVT seen on physical exam.  Recent Labs    03/23/22 2226 03/24/22 0409  HGB 9.8* 8.8*  HCT 29.4* 26.8*    Assessment/Plan: Plan for discharge tomorrow and Circumcision prior to discharge Baby has not voided - defer circ until tomorrow.    LOS: 1 day   Tyson Dense, MD 03/24/2022, 8:30 AM

## 2022-03-25 MED ORDER — ACETAMINOPHEN 325 MG PO TABS: 650.0000 mg | ORAL_TABLET | Freq: Four times a day (QID) | ORAL | 1 refills | Status: AC | PRN

## 2022-03-25 MED ORDER — FERROUS SULFATE 325 (65 FE) MG PO TABS: 325.0000 mg | ORAL_TABLET | Freq: Every day | ORAL | 3 refills | Status: DC

## 2022-03-25 MED ORDER — IBUPROFEN 600 MG PO TABS: 600.0000 mg | ORAL_TABLET | Freq: Four times a day (QID) | ORAL | 1 refills | Status: AC | PRN

## 2022-03-25 NOTE — Discharge Summary (Signed)
Postpartum Discharge Summary  Date of Service updated 03/25/22     Patient Name: Amber Murillo DOB: Jun 11, 1996 MRN: [id number]  Date of admission: 03/23/2022 Delivery date:03/23/2022  Delivering provider: Linda Hedges  Date of discharge: 03/25/2022  Admitting diagnosis: Pregnancy [Z34.90] Gestational hypertension [O13.9] Intrauterine pregnancy: [redacted]w[redacted]d    Secondary diagnosis:  Principal Problem:   Pregnancy Active Problems:   Gestational hypertension  Additional problems:     Discharge diagnosis: Term Pregnancy Delivered and Gestational Hypertension                                              Post partum procedures: Augmentation: AROM, Pitocin, and Cytotec Complications: None  Hospital course: Induction of Labor With Vaginal Delivery   26y.o. yo G1P1001 at 313w0das admitted to the hospital 03/23/2022 for induction of labor.  Indication for induction: Gestational hypertension.  Patient had an uncomplicated labor course as follows: Membrane Rupture Time/Date: 9:00 AM ,03/23/2022   Delivery Method:Vaginal, Vacuum (Extractor)  Episiotomy: None  Lacerations:  2nd degree;Perineal  Details of delivery can be found in separate delivery note.  Patient had a routine postpartum course. Patient is discharged home 03/25/22.  Newborn Data: Birth date:03/23/2022  Birth time:8:37 PM  Gender:Female  Living status:Living  Apgars:9 ,9  Weight:3620 g   Magnesium Sulfate received: No BMZ received: No Rhophylac:No MMR:No T-DaP:Given prenatally Flu: No Transfusion:No  Physical exam  Vitals:   03/24/22 1029 03/24/22 1400 03/24/22 2104 03/25/22 0542  BP: 133/75 132/82 135/82 127/78  Pulse: 76 84 75 80  Resp: 18 18 18 19   Temp: (!) 97.5 F (36.4 C) 98.3 F (36.8 C) 98.1 F (36.7 C) 97.9 F (36.6 C)  TempSrc: Oral Oral Oral Oral  SpO2: 100% 98% 97% 99%  Weight:      Height:       General: alert, cooperative, and no distress Lochia: appropriate Uterine Fundus:  firm Incision: Healing well with no significant drainage DVT Evaluation: No evidence of DVT seen on physical exam. Labs: Lab Results  Component Value Date   WBC 18.0 (H) 03/24/2022   HGB 8.8 (L) 03/24/2022   HCT 26.8 (L) 03/24/2022   MCV 83.8 03/24/2022   PLT 269 03/24/2022      Latest Ref Rng & Units 03/24/2022    4:09 AM  CMP  Glucose 70 - 99 mg/dL 91   BUN 6 - 20 mg/dL 10   Creatinine 0.44 - 1.00 mg/dL 0.90   Sodium 135 - 145 mmol/L 136   Potassium 3.5 - 5.1 mmol/L 3.9   Chloride 98 - 111 mmol/L 109   CO2 22 - 32 mmol/L 23   Calcium 8.9 - 10.3 mg/dL 8.1   Total Protein 6.5 - 8.1 g/dL 4.6   Total Bilirubin 0.3 - 1.2 mg/dL 0.7   Alkaline Phos 38 - 126 U/L 188   AST 15 - 41 U/L 23   ALT 0 - 44 U/L 12    Edinburgh Score:    03/24/2022   10:29 AM  Edinburgh Postnatal Depression Scale Screening Tool  I have been able to laugh and see the funny side of things. 0  I have looked forward with enjoyment to things. 0  I have blamed myself unnecessarily when things went wrong. 0  I have been anxious or worried for no good reason. 0  I have felt  scared or panicky for no good reason. 0  Things have been getting on top of me. 0  I have been so unhappy that I have had difficulty sleeping. 0  I have felt sad or miserable. 0  I have been so unhappy that I have been crying. 0  The thought of harming myself has occurred to me. 0  Edinburgh Postnatal Depression Scale Total 0      After visit meds:  Allergies as of 03/25/2022   No Known Allergies      Medication List     STOP taking these medications    cetirizine 10 MG chewable tablet Commonly known as: ZYRTEC       TAKE these medications    acetaminophen 325 MG tablet Commonly known as: Tylenol Take 2 tablets (650 mg total) by mouth every 6 (six) hours as needed (for pain scale < 4).   EPINEPHrine 0.3 mg/0.3 mL Soaj injection Commonly known as: EPI-PEN Inject 0.3 mg into the muscle as needed for anaphylaxis.    ferrous sulfate 325 (65 FE) MG tablet Take 1 tablet (325 mg total) by mouth daily with breakfast.   ibuprofen 600 MG tablet Commonly known as: ADVIL Take 1 tablet (600 mg total) by mouth every 6 (six) hours as needed.   levothyroxine 75 MCG tablet Commonly known as: SYNTHROID Take 100 mcg by mouth daily.   prenatal multivitamin Tabs tablet Take 1 tablet by mouth daily at 12 noon.         Discharge home in stable condition Infant Feeding: Breast Infant Disposition:home with mother Discharge instruction: per After Visit Summary and Postpartum booklet. Activity: Advance as tolerated. Pelvic rest for 6 weeks.  Diet: routine diet Anticipated Birth Control: Unsure Postpartum Appointment:1 week Additional Postpartum F/U: Postpartum Depression checkup Future Appointments: Future Appointments  Date Time Provider [place]  08/04/2022  3:15 PM [facility] None  08/04/2022  3:45 PM Benay Pike, MD [facility] None   Follow up Visit:      03/25/2022 Allena Katz, MD

## 2022-03-25 NOTE — Social Work (Signed)
MOB was referred for history of anxiety.  * Referral screened out by Clinical Social Worker because none of the following criteria appear to apply:  ~ History of anxiety/depression during this pregnancy, or of post-partum depression following prior delivery. ~ Diagnosis of anxiety and/or depression within last 3 years OR * MOB's symptoms currently being treated with medication and/or therapy.  Per chart review MOB was diagnosed with GAD in 2019 and had no concerns with her anxiety during the pregnancy. MOB was taking Wellbutrin before and has access to medication if needed.  Please contact the Clinical Social Worker if needs arise or by MOB request.  Letta Kocher Clinical Social Work [phone]

## 2022-03-25 NOTE — Progress Notes (Signed)
Post Partum Day 2 Subjective: no complaints, up ad lib, voiding, tolerating PO, and + flatus  Objective: Blood pressure 127/78, pulse 80, temperature 97.9 F (36.6 C), temperature source Oral, resp. rate 19, height 5' 1"  (1.549 m), weight 87 kg, SpO2 99 %, unknown if currently breastfeeding.  Physical Exam:  General: alert, cooperative, and no distress Lochia: appropriate Uterine Fundus: firm Incision: healing well DVT Evaluation: No evidence of DVT seen on physical exam.  Recent Labs    03/23/22 2226 03/24/22 0409  HGB 9.8* 8.8*  HCT 29.4* 26.8*    Assessment/Plan: Discharge home D/W circumcision of newborn boy. Risks discussed. She states she understand and agrees.   LOS: 2 days   Allena Katz, MD 03/25/2022, 7:15 AM

## 2022-04-02 ENCOUNTER — Telehealth (HOSPITAL_COMMUNITY): Payer: Self-pay | Admitting: *Deleted

## 2022-04-02 NOTE — Telephone Encounter (Signed)
Patient voiced no questions or concerns regarding her health at this time. Patient voiced no questions or concerns regarding infant at this time. Call was dropped. RN placed return call and left message for patient to call with questions or concerns. EPDS not completed.  Erline Levine, RN, 04/02/22, 1006

## 2022-04-06 ENCOUNTER — Encounter (INDEPENDENT_AMBULATORY_CARE_PROVIDER_SITE_OTHER): Payer: Self-pay

## 2022-08-04 ENCOUNTER — Other Ambulatory Visit: Payer: Managed Care, Other (non HMO)

## 2022-08-04 ENCOUNTER — Ambulatory Visit: Payer: Managed Care, Other (non HMO) | Admitting: Hematology and Oncology

## 2022-11-25 ENCOUNTER — Ambulatory Visit
Admission: RE | Admit: 2022-11-25 | Discharge: 2022-11-25 | Disposition: A | Discharge status: Home / Self Care | Payer: Managed Care, Other (non HMO) | Source: Ambulatory Visit | Attending: Family Medicine | Admitting: Family Medicine

## 2022-11-25 ENCOUNTER — Telehealth: Payer: Self-pay | Admitting: Family Medicine

## 2022-11-25 VITALS — BP 116/79 | HR 91 | Temp 97.9°F | Resp 18

## 2022-11-25 DIAGNOSIS — J301 Allergic rhinitis due to pollen: Secondary | ICD-10-CM | POA: Insufficient documentation

## 2022-11-25 DIAGNOSIS — Z1152 Encounter for screening for COVID-19: Secondary | ICD-10-CM | POA: Diagnosis not present

## 2022-11-25 DIAGNOSIS — J069 Acute upper respiratory infection, unspecified: Secondary | ICD-10-CM | POA: Diagnosis not present

## 2022-11-25 DIAGNOSIS — H1033 Unspecified acute conjunctivitis, bilateral: Secondary | ICD-10-CM | POA: Diagnosis not present

## 2022-11-25 MED ORDER — GENTAMICIN SULFATE 0.3 % OP SOLN: 2.0000 [drp] | Freq: Three times a day (TID) | OPHTHALMIC | 0 refills | Status: AC

## 2022-11-25 MED ORDER — PREDNISONE 20 MG PO TABS: 40.0000 mg | ORAL_TABLET | Freq: Every day | ORAL | 0 refills | Status: AC

## 2022-11-25 NOTE — Telephone Encounter (Signed)
Patient called requesting prescription for some oral steroids.  She had declined my offer of injectable steroids when she was here being seen for her allergies.  She now would like to try some orally.  She is nursing.  Prednisone sent her a 3-day burst.

## 2022-11-25 NOTE — Discharge Instructions (Addendum)
Put gentamicin eyedrops in the affected eye(s) 3 times daily for 5 days.  You can use Flonase nose spray 2 sprays each nostril once daily.  Can buy this over-the-counter   You have been swabbed for COVID, and the test will result in the next 24 hours. Our staff will call you if positive. If the COVID test is positive, you should quarantine until you are fever free for 24 hours and you are starting to feel better, and then take added precautions for the next 5 days, such as physical distancing/wearing a mask and good hand hygiene/washing.

## 2022-11-25 NOTE — ED Provider Notes (Addendum)
[facility]    CSN: [id number] Arrival date & time: 11/25/22  1145      History   Chief Complaint Chief Complaint  Patient presents with   Conjunctivitis    HPI Amber Murillo is a 27 y.o. female.    Conjunctivitis   Here for itchy red eyes that has been draining.  This started yesterday.  She had had nasal congestion and sneezing and a little bit of itchiness in her throat for a few days before this started.  No fever or chills and no vomiting or diarrhea.  Her 71-month-old is in daycare and has had similar symptoms.  Past Medical History:  Diagnosis Date   ADHD (attention deficit hyperactivity disorder)    GERD (gastroesophageal reflux disease)    Thyroid disease     Patient Active Problem List   Diagnosis Date Noted   Pregnancy 03/23/2022   Gestational hypertension 03/23/2022   Attention deficit hyperactivity disorder 05/14/2019   Counseling on health promotion and disease prevention 12/11/2018   Feeling uptight 12/11/2018   Excessive sweating 06/20/2018   Loss of hair 06/20/2018   Thin nails 06/20/2018   GERD (gastroesophageal reflux disease) 04/18/2018   Hypothyroidism 04/18/2018   Generalized arthritis 04/18/2018   Environmental and seasonal allergies 04/18/2018   Transient gluten sensitivity 04/18/2018   Other fatigue 04/18/2018   Overweight (BMI 25.0-29.9) 04/18/2018   GAD (generalized anxiety disorder) 01/10/2018   Preventative health care 01/12/2016   Rash and nonspecific skin eruption 01/12/2016   ADHD (attention deficit hyperactivity disorder) 08/11/2015    Past Surgical History:  Procedure Laterality Date   WISDOM TOOTH EXTRACTION      OB History     Gravida  1   Para  1   Term  1   Preterm      AB      Living  1      SAB      IAB      Ectopic      Multiple  0   Live Births  1            Home Medications    Prior to Admission medications   Medication Sig Start Date End Date Taking? Authorizing  Provider  acetaminophen (TYLENOL) 325 MG tablet Take 2 tablets (650 mg total) by mouth every 6 (six) hours as needed (for pain scale < 4). 03/25/22  Yes Everlene Farrier, MD  cetirizine (ZYRTEC) 10 MG tablet Take 10 mg by mouth daily.   Yes [provider]  gentamicin (GARAMYCIN) 0.3 % ophthalmic solution Place 2 drops into both eyes 3 (three) times daily for 5 days. 11/25/22 11/30/22 Yes Barrett Henle, MD  ibuprofen (ADVIL) 600 MG tablet Take 1 tablet (600 mg total) by mouth every 6 (six) hours as needed. 03/25/22  Yes Everlene Farrier, MD  levothyroxine (SYNTHROID) 75 MCG tablet Take 100 mcg by mouth daily. 01/06/20  Yes [provider]  Prenatal Vit-Fe Fumarate-FA (PRENATAL MULTIVITAMIN) TABS tablet Take 1 tablet by mouth daily at 12 noon.   Yes [provider]  EPINEPHrine 0.3 mg/0.3 mL IJ SOAJ injection Inject 0.3 mg into the muscle as needed for anaphylaxis. 03/30/21   Lorrene Reid, PA-C    Family History Family History  Problem Relation Age of Onset   Hyperlipidemia Father    Hypothyroidism Maternal Grandmother    Hashimoto's thyroiditis Maternal Grandmother     Social History Social History   Tobacco Use   Smoking status: Never  Smokeless tobacco: Never  Vaping Use   Vaping Use: Never used  Substance Use Topics   Alcohol use: No    Alcohol/week: 0.0 standard drinks of alcohol   Drug use: No     Allergies   Patient has no known allergies.   Review of Systems Review of Systems   Physical Exam Triage Vital Signs ED Triage Vitals  Enc Vitals Group     BP 11/25/22 1206 116/79     Pulse Rate 11/25/22 1206 91     Resp 11/25/22 1206 18     Temp 11/25/22 1206 97.9 F (36.6 C)     Temp Source 11/25/22 1206 Oral     SpO2 11/25/22 1206 95 %     Weight --      Height --      Head Circumference --      Peak Flow --      Pain Score 11/25/22 1201 0     Pain Loc --      Pain Edu? --      Excl. in [place]? --    No data found.  Updated Vital  Signs BP 116/79 (BP Location: Left Arm)   Pulse 91   Temp 97.9 F (36.6 C) (Oral)   Resp 18   SpO2 95%   Breastfeeding Yes   Visual Acuity Right Eye Distance:   Left Eye Distance:   Bilateral Distance:    Right Eye Near:   Left Eye Near:    Bilateral Near:     Physical Exam Vitals reviewed.  Constitutional:      General: She is not in acute distress.    Appearance: She is not toxic-appearing.  HENT:     Right Ear: Tympanic membrane and ear canal normal.     Left Ear: Tympanic membrane and ear canal normal.     Nose: Nose normal.     Mouth/Throat:     Mouth: Mucous membranes are moist.     Comments: There is mild erythema of the posterior oropharynx and clear mucus draining Eyes:     Extraocular Movements: Extraocular movements intact.     Pupils: Pupils are equal, round, and reactive to light.     Comments: Bilateral conjunctiva are injected.  Lids are not swollen.   Cardiovascular:     Rate and Rhythm: Normal rate and regular rhythm.     Heart sounds: No murmur heard. Pulmonary:     Effort: Pulmonary effort is normal. No respiratory distress.     Breath sounds: No stridor. No wheezing, rhonchi or rales.  Musculoskeletal:     Cervical back: Neck supple.  Lymphadenopathy:     Cervical: No cervical adenopathy.  Skin:    Capillary Refill: Capillary refill takes less than 2 seconds.     Coloration: Skin is not jaundiced or pale.  Neurological:     General: No focal deficit present.     Mental Status: She is alert and oriented to person, place, and time.  Psychiatric:        Behavior: Behavior normal.      UC Treatments / Results  Labs (all labs ordered are listed, but only abnormal results are displayed) Labs Reviewed  SARS CORONAVIRUS 2 (TAT 6-24 HRS)    EKG   Radiology No results found.  Procedures Procedures (including critical care time)  Medications Ordered in UC Medications - No data to display  Initial Impression / Assessment and Plan / UC  Course  I have reviewed the triage  vital signs and the nursing notes.  Pertinent labs & imaging results that were available during my care of the patient were reviewed by me and considered in my medical decision making (see chart for details).        Gentamicin will be sent in for the conjunctivitis.  COVID swab was done today, and if positive she will know she needs to quarantine.  We discussed that this could be allergies or another viral illness.  I offered an injection of steroids for the allergies, and she declined.  She is going to get some Flonase over-the-counter and continue Zyrtec Final Clinical Impressions(s) / UC Diagnoses   Final diagnoses:  Acute conjunctivitis of both eyes, unspecified acute conjunctivitis type  Viral upper respiratory tract infection  Seasonal allergic rhinitis due to pollen     Discharge Instructions      Put gentamicin eyedrops in the affected eye(s) 3 times daily for 5 days.  You can use Flonase nose spray 2 sprays each nostril once daily.  Can buy this over-the-counter   You have been swabbed for COVID, and the test will result in the next 24 hours. Our staff will call you if positive. If the COVID test is positive, you should quarantine until you are fever free for 24 hours and you are starting to feel better, and then take added precautions for the next 5 days, such as physical distancing/wearing a mask and good hand hygiene/washing.      ED Prescriptions     Medication Sig Dispense Auth. Provider   gentamicin (GARAMYCIN) 0.3 % ophthalmic solution Place 2 drops into both eyes 3 (three) times daily for 5 days. 5 mL Barrett Henle, MD      PDMP not reviewed this encounter.   Barrett Henle, MD 11/25/22 1233    Barrett Henle, MD 11/25/22 [phone]

## 2022-11-25 NOTE — ED Triage Notes (Signed)
Pt reports itching, watery eyes x 1 day.  Is having drainage and redness.  R eye was matted shut last night.

## 2022-11-26 LAB — SARS CORONAVIRUS 2 (TAT 6-24 HRS): SARS Coronavirus 2: NEGATIVE

## 2023-03-09 ENCOUNTER — Other Ambulatory Visit: Payer: Self-pay

## 2023-03-09 ENCOUNTER — Emergency Department (HOSPITAL_COMMUNITY)
Admission: EM | Admit: 2023-03-09 | Discharge: 2023-03-10 | Disposition: A | Discharge status: Home / Self Care | Payer: 59 | Attending: Emergency Medicine | Admitting: Emergency Medicine

## 2023-03-09 DIAGNOSIS — M542 Cervicalgia: Secondary | ICD-10-CM | POA: Insufficient documentation

## 2023-03-09 DIAGNOSIS — R519 Headache, unspecified: Secondary | ICD-10-CM | POA: Insufficient documentation

## 2023-03-09 DIAGNOSIS — R110 Nausea: Secondary | ICD-10-CM | POA: Insufficient documentation

## 2023-03-09 DIAGNOSIS — R000 Tachycardia, unspecified: Secondary | ICD-10-CM | POA: Diagnosis not present

## 2023-03-09 DIAGNOSIS — R509 Fever, unspecified: Secondary | ICD-10-CM | POA: Insufficient documentation

## 2023-03-09 DIAGNOSIS — Z20822 Contact with and (suspected) exposure to covid-19: Secondary | ICD-10-CM | POA: Diagnosis not present

## 2023-03-09 LAB — COMPREHENSIVE METABOLIC PANEL
ALT: 23 U/L (ref 0–44)
AST: 23 U/L (ref 15–41)
Albumin: 3.7 g/dL (ref 3.5–5.0)
Alkaline Phosphatase: 84 U/L (ref 38–126)
Anion gap: 14 (ref 5–15)
BUN: 12 mg/dL (ref 6–20)
CO2: 25 mmol/L (ref 22–32)
Calcium: 8.8 mg/dL — ABNORMAL LOW (ref 8.9–10.3)
Chloride: 99 mmol/L (ref 98–111)
Creatinine, Ser: 0.8 mg/dL (ref 0.44–1.00)
GFR, Estimated: >60 mL/min (ref >60)
Glucose, Bld: 101 mg/dL — ABNORMAL HIGH (ref 70–99)
Potassium: 3.9 mmol/L (ref 3.5–5.1)
Sodium: 138 mmol/L (ref 135–145)
Total Bilirubin: 0.5 mg/dL (ref 0.3–1.2)
Total Protein: 6.9 g/dL (ref 6.5–8.1)

## 2023-03-09 LAB — CBC WITH DIFFERENTIAL/PLATELET
Abs Immature Granulocytes: 0.01 10*3/uL (ref 0.00–0.07)
Basophils Absolute: 0 10*3/uL (ref 0.0–0.1)
Basophils Relative: 1 %
Eosinophils Absolute: 0.1 10*3/uL (ref 0.0–0.5)
Eosinophils Relative: 1 %
HCT: 39.7 % (ref 36.0–46.0)
Hemoglobin: 13.5 g/dL (ref 12.0–15.0)
Immature Granulocytes: 0 %
Lymphocytes Relative: 16 %
Lymphs Abs: 0.9 10*3/uL (ref 0.7–4.0)
MCH: 30 pg (ref 26.0–34.0)
MCHC: 34 g/dL (ref 30.0–36.0)
MCV: 88.2 fL (ref 80.0–100.0)
Monocytes Absolute: 0.5 10*3/uL (ref 0.1–1.0)
Monocytes Relative: 9 %
Neutro Abs: 4.1 10*3/uL (ref 1.7–7.7)
Neutrophils Relative %: 73 %
Platelets: 258 10*3/uL (ref 150–400)
RBC: 4.5 MIL/uL (ref 3.87–5.11)
RDW: 13.2 % (ref 11.5–15.5)
WBC: 5.5 10*3/uL (ref 4.0–10.5)
nRBC: 0 % (ref 0.0–0.2)

## 2023-03-09 LAB — SARS CORONAVIRUS 2 BY RT PCR: SARS Coronavirus 2 by RT PCR: NEGATIVE

## 2023-03-09 LAB — HCG, SERUM, QUALITATIVE: Preg, Serum: NEGATIVE

## 2023-03-09 MED ORDER — ONDANSETRON 4 MG PO TBDP
4.0000 mg | ORAL_TABLET | Freq: Once | ORAL | Status: DC
  Filled 2023-03-09: qty 1

## 2023-03-09 MED ORDER — IBUPROFEN 800 MG PO TABS
800.0000 mg | ORAL_TABLET | Freq: Once | ORAL | Status: AC
  Administered 2023-03-09: 800 mg via ORAL
  Filled 2023-03-09: qty 1

## 2023-03-09 NOTE — ED Triage Notes (Signed)
Patient reports headache with fever , posterior neck pain /stiffness radiating to shoulders onset yesterday , denies head injury .

## 2023-03-09 NOTE — ED Provider Notes (Signed)
[place] EMERGENCY DEPARTMENT AT [facility] Provider Note   CSN: [id number] Arrival date & time: 03/09/23  2146     History  Chief Complaint  Patient presents with   Headache   Neck Pain    SUEZETTE MORIMOTO is a 27 y.o. female with history of ADHD, GERD, and thyroid disease who presents to the ER complaining of headache, fever, neck pain/stiffness starting yesterday. Pain radiating from posterior neck to bilateral shoulders. No head injury. Drives more frequently with her new job and not sure if posture change was bothering her neck more. Got somewhat better with NSAIDs. States her coworker tested positive for COVID-19.    Headache Associated symptoms: fever, nausea and neck pain   Neck Pain Associated symptoms: fever and headaches        Home Medications Prior to Admission medications   Medication Sig Start Date End Date Taking? Authorizing Provider  lidocaine (LIDODERM) 5 % Place 1 patch onto the skin daily. Remove & Discard patch within 12 hours or as directed by MD 03/10/23  Yes Janissa Bertram T, PA-C  acetaminophen (TYLENOL) 325 MG tablet Take 2 tablets (650 mg total) by mouth every 6 (six) hours as needed (for pain scale < 4). 03/25/22   Harold Hedge, MD  cetirizine (ZYRTEC) 10 MG tablet Take 10 mg by mouth daily.    [provider]  EPINEPHrine 0.3 mg/0.3 mL IJ SOAJ injection Inject 0.3 mg into the muscle as needed for anaphylaxis. 03/30/21   Mayer Masker, PA-C  ibuprofen (ADVIL) 600 MG tablet Take 1 tablet (600 mg total) by mouth every 6 (six) hours as needed. 03/25/22   Harold Hedge, MD  levothyroxine (SYNTHROID) 75 MCG tablet Take 100 mcg by mouth daily. 01/06/20   [provider]  Prenatal Vit-Fe Fumarate-FA (PRENATAL MULTIVITAMIN) TABS tablet Take 1 tablet by mouth daily at 12 noon.    [provider]      Allergies    Patient has no known allergies.    Review of Systems   Review of Systems  Constitutional:  Positive  for chills and fever.  Gastrointestinal:  Positive for nausea.  Musculoskeletal:  Positive for neck pain.  Neurological:  Positive for headaches.  All other systems reviewed and are negative.   Physical Exam Updated Vital Signs BP 127/74 (BP Location: Right Arm)   Pulse 100   Temp 99.8 F (37.7 C) (Oral)   Resp 18   SpO2 97%  Physical Exam Vitals and nursing note reviewed.  Constitutional:      Appearance: Normal appearance.  HENT:     Head: Normocephalic and atraumatic.  Eyes:     Conjunctiva/sclera: Conjunctivae normal.  Neck:     Meningeal: Brudzinski's sign and Kernig's sign absent.     Comments: No nuchal rigidity Cardiovascular:     Rate and Rhythm: Regular rhythm. Tachycardia present.  Pulmonary:     Effort: Pulmonary effort is normal. No respiratory distress.     Breath sounds: Normal breath sounds.  Abdominal:     General: There is no distension.     Palpations: Abdomen is soft.     Tenderness: There is no abdominal tenderness.  Musculoskeletal:     Cervical back: No rigidity or crepitus. Pain with movement and muscular tenderness present. No spinous process tenderness. Decreased range of motion.     Right lower leg: No edema.     Left lower leg: No edema.  Lymphadenopathy:     Cervical: No cervical adenopathy.  Skin:    General: Skin is warm and dry.  Neurological:     General: No focal deficit present.     Mental Status: She is alert.     ED Results / Procedures / Treatments   Labs (all labs ordered are listed, but only abnormal results are displayed) Labs Reviewed  COMPREHENSIVE METABOLIC PANEL - Abnormal; Notable for the following components:      Result Value   Glucose, Bld 101 (*)    Calcium 8.8 (*)    All other components within normal limits  SARS CORONAVIRUS 2 BY RT PCR  CBC WITH DIFFERENTIAL/PLATELET  HCG, SERUM, QUALITATIVE    EKG None  Radiology No results found.  Procedures Procedures    Medications Ordered in  ED Medications  lidocaine (LIDODERM) 5 % 1 patch (1 patch Transdermal Patch Applied 03/10/23 0116)  ibuprofen (ADVIL) tablet 800 mg (800 mg Oral Given 03/09/23 2326)    ED Course/ Medical Decision Making/ A&P                             Medical Decision Making Amount and/or Complexity of Data Reviewed Labs: ordered.  Risk Prescription drug management.   This patient is a 27 y.o. female  who presents to the ED for concern of fever and neck pain.   Differential diagnoses prior to evaluation: The emergent differential diagnosis includes, but is not limited to,  meningitis (viral, bacterial), sepsis, viral infection, tension headache. This is not an exhaustive differential.   Past Medical History / Co-morbidities / Social History: ADHD, GERD, and thyroid disease   Additional history: Chart reviewed. Pertinent results include: Patient had access to her most recent thyroid testing, I reviewed these with her, normal TSH and free T4.  Physical Exam: Physical exam performed. The pertinent findings include: Initially tachycardic to 125, febrile to 100.5 F.  Given Motrin.  Patient clinically well-appearing.  No nuchal rigidity.  Some decreased range of motion of neck to flexion, and rotation bilaterally.  No Brudzinki or Kernig sign.  No rash.  Lab Tests/Imaging studies: I personally interpreted labs/imaging and the pertinent results include: No leukocytosis, normal hemoglobin.  CMP unremarkable.  Negative COVID, negative pregnancy.  Medications: I ordered medication including ibuprofen, lidocaine patch.  I have reviewed the patients home medicines and have made adjustments as needed.   Disposition: After consideration of the diagnostic results and the patients response to treatment, I feel that emergency department workup does not suggest an emergent condition requiring admission or immediate intervention beyond what has been performed at this time.   Had thorough shared decision making  conversation with patient regarding possible lumbar puncture for further evaluation of possible meningitis. Although I have low clinical suspicion for meningitis, patient presenting with concerning symptoms and history. We discussed that it is reassuring she has a normal white blood cell count, and her vital signs stabilized after her fever improved with antipyretics. She had improvement of neck ROM after motrin as well. Patient given opportunity to ask questions and make decision regarding provided information. She declines lumbar puncture at this time. She would like to go home. I think this is reasonable. We discussed taking over the counter medications for neck pain, headache, and fever. Discussed muscle relaxer, opted not to since patient is breastfeeding. Will try lidocaine patch. Patient given strict return precautions.   The patient is safe for discharge and has been instructed to return immediately for worsening symptoms, change in  symptoms or any other concerns.  I discussed this case with my attending physician Dr. Clayborne Dana who cosigned this note including patient's presenting symptoms, physical exam, and planned diagnostics and interventions. Attending physician stated agreement with plan or made changes to plan which were implemented.   Final Clinical Impression(s) / ED Diagnoses Final diagnoses:  Acute nonintractable headache, unspecified headache type  Neck pain  Fever, unspecified fever cause    Rx / DC Orders ED Discharge Orders          Ordered    lidocaine (LIDODERM) 5 %  Every 24 hours        03/10/23 0117           Portions of this report may have been transcribed using voice recognition software. Every effort was made to ensure accuracy; however, inadvertent computerized transcription errors may be present.    Jeanella Flattery 03/10/23 0238    Marily Memos, MD 03/10/23 [phone]

## 2023-03-09 NOTE — ED Provider Notes (Incomplete)
[name] Provider Note   CSN: [id number] Arrival date & time: 03/09/23  2146     History {Add pertinent medical, surgical, social history, OB history to HPI:1} Chief Complaint  Patient presents with  . Headache  . Neck Pain    Amber Murillo is a 27 y.o. female with history of ADHD, GERD, and thyroid disease who presents to the ER complaining of headache, fever, neck pain/stiffness starting yesterday. Pain radiating from posterior neck to bilateral shoulders. No head injury. Drives more frequently with her new job and not sure if posture change was bothering her neck more. Got somewhat better with NSAIDs. States her coworker tested positive for COVID-19.    Headache Associated symptoms: fever, nausea and neck pain   Neck Pain Associated symptoms: fever and headaches        Home Medications Prior to Admission medications   Medication Sig Start Date End Date Taking? Authorizing Provider  acetaminophen (TYLENOL) 325 MG tablet Take 2 tablets (650 mg total) by mouth every 6 (six) hours as needed (for pain scale < 4). 03/25/22   Harold Hedge, MD  cetirizine (ZYRTEC) 10 MG tablet Take 10 mg by mouth daily.    [provider]  EPINEPHrine 0.3 mg/0.3 mL IJ SOAJ injection Inject 0.3 mg into the muscle as needed for anaphylaxis. 03/30/21   Mayer Masker, PA-C  ibuprofen (ADVIL) 600 MG tablet Take 1 tablet (600 mg total) by mouth every 6 (six) hours as needed. 03/25/22   Harold Hedge, MD  levothyroxine (SYNTHROID) 75 MCG tablet Take 100 mcg by mouth daily. 01/06/20   [provider]  Prenatal Vit-Fe Fumarate-FA (PRENATAL MULTIVITAMIN) TABS tablet Take 1 tablet by mouth daily at 12 noon.    [provider]      Allergies    Patient has no known allergies.    Review of Systems   Review of Systems  Constitutional:  Positive for chills and fever.  Gastrointestinal:  Positive for nausea.  Musculoskeletal:   Positive for neck pain.  Neurological:  Positive for headaches.  All other systems reviewed and are negative.   Physical Exam Updated Vital Signs BP 136/86 (BP Location: Right Arm)   Pulse (!) 125   Temp (!) 100.5 F (38.1 C) (Oral)   Resp 20   SpO2 98%  Physical Exam Vitals and nursing note reviewed.  Constitutional:      Appearance: Normal appearance.  HENT:     Head: Normocephalic and atraumatic.  Eyes:     Conjunctiva/sclera: Conjunctivae normal.  Neck:     Comments: No nuchal rigidity Cardiovascular:     Rate and Rhythm: Regular rhythm. Tachycardia present.  Pulmonary:     Effort: Pulmonary effort is normal. No respiratory distress.     Breath sounds: Normal breath sounds.  Abdominal:     General: There is no distension.     Palpations: Abdomen is soft.     Tenderness: There is no abdominal tenderness.  Musculoskeletal:     Right lower leg: No edema.     Left lower leg: No edema.  Skin:    General: Skin is warm and dry.  Neurological:     General: No focal deficit present.     Mental Status: She is alert.     ED Results / Procedures / Treatments   Labs (all labs ordered are listed, but only abnormal results are displayed) Labs Reviewed  COMPREHENSIVE METABOLIC PANEL - Abnormal; Notable for the following  components:      Result Value   Glucose, Bld 101 (*)    Calcium 8.8 (*)    All other components within normal limits  SARS CORONAVIRUS 2 BY RT PCR  CBC WITH DIFFERENTIAL/PLATELET  HCG, SERUM, QUALITATIVE    EKG None  Radiology No results found.  Procedures Procedures  {Document cardiac monitor, telemetry assessment procedure when appropriate:1}  Medications Ordered in ED Medications - No data to display  ED Course/ Medical Decision Making/ A&P   {   Click here for ABCD2, HEART and other calculatorsREFRESH Note before signing :1}                          Medical Decision Making  This patient is a 27 y.o. female  who presents to the ED  for concern of ***.   Differential diagnoses prior to evaluation: The emergent differential diagnosis includes, but is not limited to,  *** . This is not an exhaustive differential.   Past Medical History / Co-morbidities / Social History: ***  Additional history: Chart reviewed. Pertinent results include: ***  Physical Exam: Physical exam performed. The pertinent findings include: ***  Lab Tests/Imaging studies: I personally interpreted labs/imaging and the pertinent results include:  ***. ***I agree with the radiologist interpretation.  Cardiac monitoring: EKG obtained and interpreted by myself and attending physician which shows: ***   Medications: I ordered medication including ***.  I have reviewed the patients home medicines and have made adjustments as needed.   Disposition: After consideration of the diagnostic results and the patients response to treatment, I feel that *** .   ***emergency department workup does not suggest an emergent condition requiring admission or immediate intervention beyond what has been performed at this time. The plan is: ***. The patient is safe for discharge and has been instructed to return immediately for worsening symptoms, change in symptoms or any other concerns.  Final Clinical Impression(s) / ED Diagnoses Final diagnoses:  None    Rx / DC Orders ED Discharge Orders     None      Portions of this report may have been transcribed using voice recognition software. Every effort was made to ensure accuracy; however, inadvertent computerized transcription errors may be present.

## 2023-03-10 MED ORDER — LIDOCAINE 5 % EX PTCH
1.0000 | MEDICATED_PATCH | Freq: Once | CUTANEOUS | Status: DC
  Administered 2023-03-10: 1 via TRANSDERMAL
  Filled 2023-03-10: qty 1

## 2023-03-10 MED ORDER — LIDOCAINE 5 % EX PTCH
1.0000 | MEDICATED_PATCH | CUTANEOUS | 0 refills | Status: AC
Start: 2023-03-10 — End: ?

## 2023-03-10 NOTE — Discharge Instructions (Addendum)
You were seen in the ER for fever and neck stiffness.   As we discussed, your blood work looked reassuring today. Your white blood cell count was normal. Your heart rate improved after ibuprofen when your fever improved.   We discussed the possible risk of meningitis and whether or not to perform a lumbar puncture, and you have declined this at this time. I feel this is reasonable. However, it is incredibly important that you keep a close eye on your symptoms and return to the ER for ANY new or worsening symptoms.   Please use acetaminophen (Tylenol) or ibuprofen (Advil, Motrin) for pain.  You may use 800 mg ibuprofen every 6 hours or 1000 mg of acetaminophen every 6 hours.  You may choose to alternate between the two, this would be most effective. Do not exceed 4000 mg of acetaminophen within 24 hours.  Do not exceed 3200 mg ibuprofen within 24 hours.  I have prescribed you lidocaine patches you can wear on the back of your neck. You can also use a heating pad or topical creams to help your neck tension.  Continue to monitor how you're doing and return to the ER for new or worsening symptoms such as change in mental status (confusion, disorientation), severe headache, rash, change in your speech, difficulty moving your extremities, pain with ranging your legs while laying down, etc.

## 2023-03-12 ENCOUNTER — Ambulatory Visit
Admission: EM | Admit: 2023-03-12 | Discharge: 2023-03-12 | Disposition: A | Discharge status: Home / Self Care | Payer: 59 | Attending: Emergency Medicine | Admitting: Emergency Medicine

## 2023-03-12 DIAGNOSIS — B349 Viral infection, unspecified: Secondary | ICD-10-CM | POA: Diagnosis not present

## 2023-03-12 DIAGNOSIS — M545 Low back pain, unspecified: Secondary | ICD-10-CM | POA: Diagnosis not present

## 2023-03-12 DIAGNOSIS — K219 Gastro-esophageal reflux disease without esophagitis: Secondary | ICD-10-CM | POA: Insufficient documentation

## 2023-03-12 DIAGNOSIS — Z20822 Contact with and (suspected) exposure to covid-19: Secondary | ICD-10-CM | POA: Diagnosis not present

## 2023-03-12 DIAGNOSIS — K580 Irritable bowel syndrome with diarrhea: Secondary | ICD-10-CM | POA: Insufficient documentation

## 2023-03-12 MED ORDER — AMOXICILLIN-POT CLAVULANATE 875-125 MG PO TABS: 1.0000 | ORAL_TABLET | Freq: Two times a day (BID) | ORAL | 0 refills | Status: DC

## 2023-03-12 NOTE — ED Provider Notes (Signed)
[facility]    CSN: [id number] Arrival date & time: 03/12/23  0850      History   Chief Complaint Chief Complaint  Patient presents with   Fever   Back Pain    HPI Amber Murillo is a 27 y.o. female.   Patient presents for evaluation of a fever, tachycardia, neck pain and back pain present for 3 days.  Fever peaking at 102 with associated tachycardia, resolves with use of Tylenol and ibuprofen.  Neck pain is to the posterior radiating into the bilateral shoulders causing stiffness and limited range of motion, improved with use of Tylenol and ibuprofen.  At onset of initial symptoms was evaluated in the emergency department to rule out meningitis, at this time deemed stable and declined lumbar puncture.  At that time emergency department doctor recommended reevaluation in a few days if symptoms continue to persist.  Patient now experiencing lower back pain but able to complete range of motion.  Denies congestion, ear pain, sore throat, cough, urinary or vaginal symptoms, abdominal symptoms.  Decreased appetite but this has begun to improve therefore tolerating food and liquids.  Has known COVID exposure, COVID test completed in emergency department negative.  Has been experiencing increase in diarrhea, has IBS at baseline.  Currently breast-feeding.     Past Medical History:  Diagnosis Date   ADHD (attention deficit hyperactivity disorder)    GERD (gastroesophageal reflux disease)    Thyroid disease     Patient Active Problem List   Diagnosis Date Noted   Pregnancy 03/23/2022   Gestational hypertension 03/23/2022   Attention deficit hyperactivity disorder 05/14/2019   Counseling on health promotion and disease prevention 12/11/2018   Feeling uptight 12/11/2018   Excessive sweating 06/20/2018   Loss of hair 06/20/2018   Thin nails 06/20/2018   GERD (gastroesophageal reflux disease) 04/18/2018   Hypothyroidism 04/18/2018   Generalized arthritis 04/18/2018    Environmental and seasonal allergies 04/18/2018   Transient gluten sensitivity 04/18/2018   Other fatigue 04/18/2018   Overweight (BMI 25.0-29.9) 04/18/2018   GAD (generalized anxiety disorder) 01/10/2018   Preventative health care 01/12/2016   Rash and nonspecific skin eruption 01/12/2016   ADHD (attention deficit hyperactivity disorder) 08/11/2015    Past Surgical History:  Procedure Laterality Date   WISDOM TOOTH EXTRACTION      OB History     Gravida  1   Para  1   Term  1   Preterm      AB      Living  1      SAB      IAB      Ectopic      Multiple  0   Live Births  1            Home Medications    Prior to Admission medications   Medication Sig Start Date End Date Taking? Authorizing Provider  acetaminophen (TYLENOL) 325 MG tablet Take 2 tablets (650 mg total) by mouth every 6 (six) hours as needed (for pain scale < 4). 03/25/22   Harold Hedge, MD  cetirizine (ZYRTEC) 10 MG tablet Take 10 mg by mouth daily.    [provider]  EPINEPHrine 0.3 mg/0.3 mL IJ SOAJ injection Inject 0.3 mg into the muscle as needed for anaphylaxis. 03/30/21   Mayer Masker, PA-C  ibuprofen (ADVIL) 600 MG tablet Take 1 tablet (600 mg total) by mouth every 6 (six) hours as needed. 03/25/22   Harold Hedge, MD  levothyroxine (  SYNTHROID) 75 MCG tablet Take 100 mcg by mouth daily. 01/06/20   [provider]  lidocaine (LIDODERM) 5 % Place 1 patch onto the skin daily. Remove & Discard patch within 12 hours or as directed by MD 03/10/23   Roemhildt, Lorin T, PA-C  Prenatal Vit-Fe Fumarate-FA (PRENATAL MULTIVITAMIN) TABS tablet Take 1 tablet by mouth daily at 12 noon.    [provider]    Family History Family History  Problem Relation Age of Onset   Hyperlipidemia Father    Hypothyroidism Maternal Grandmother    Hashimoto's thyroiditis Maternal Grandmother     Social History Social History   Tobacco Use   Smoking status: Never   Smokeless  tobacco: Never  Vaping Use   Vaping status: Never Used  Substance Use Topics   Alcohol use: No    Alcohol/week: 0.0 standard drinks of alcohol   Drug use: No     Allergies   Patient has no known allergies.   Review of Systems Review of Systems  Constitutional:  Positive for fever. Negative for activity change, appetite change, chills, diaphoresis, fatigue and unexpected weight change.  HENT: Negative.    Respiratory: Negative.    Cardiovascular: Negative.   Musculoskeletal:  Positive for back pain and neck pain. Negative for arthralgias, gait problem, joint swelling, myalgias and neck stiffness.  Skin: Negative.      Physical Exam Triage Vital Signs ED Triage Vitals  Encounter Vitals Group     BP 03/12/23 0955 102/72     Systolic BP Percentile --      Diastolic BP Percentile --      Pulse Rate 03/12/23 0955 84     Resp 03/12/23 0955 16     Temp 03/12/23 0955 97.9 F (36.6 C)     Temp Source 03/12/23 0955 Oral     SpO2 03/12/23 0955 98 %     Weight --      Height --      Head Circumference --      Peak Flow --      Pain Score 03/12/23 0958 0     Pain Loc --      Pain Education --      Exclude from Growth Chart --    No data found.  Updated Vital Signs BP 102/72 (BP Location: Left Arm)   Pulse 84   Temp 97.9 F (36.6 C) (Oral)   Resp 16   SpO2 98%   Breastfeeding Yes   Visual Acuity Right Eye Distance:   Left Eye Distance:   Bilateral Distance:    Right Eye Near:   Left Eye Near:    Bilateral Near:     Physical Exam Constitutional:      Appearance: Normal appearance.  HENT:     Head: Normocephalic.     Right Ear: Tympanic membrane, ear canal and external ear normal.     Left Ear: Tympanic membrane, ear canal and external ear normal.     Nose: Nose normal.     Mouth/Throat:     Mouth: Mucous membranes are moist.     Pharynx: Oropharynx is clear.  Eyes:     Extraocular Movements: Extraocular movements intact.  Neck:     Comments: No  tenderness, ecchymosis swelling or deformity on exam not crepitus or rigidity, full range of motion, 2+ carotid pulses bilaterally, strength is a 5 out of 5 Cardiovascular:     Rate and Rhythm: Normal rate and regular rhythm.  Pulses: Normal pulses.     Heart sounds: Normal heart sounds.  Pulmonary:     Effort: Pulmonary effort is normal.     Breath sounds: Normal breath sounds.  Musculoskeletal:     Comments: No tenderness, ecchymosis swelling or deformity on exam, able to twist turn and bend  Neurological:     Mental Status: She is alert and oriented to person, place, and time. Mental status is at baseline.      UC Treatments / Results  Labs (all labs ordered are listed, but only abnormal results are displayed) Labs Reviewed - No data to display  EKG   Radiology No results found.  Procedures Procedures (including critical care time)  Medications Ordered in UC Medications - No data to display  Initial Impression / Assessment and Plan / UC Course  I have reviewed the triage vital signs and the nursing notes.  Pertinent labs & imaging results that were available during my care of the patient were reviewed by me and considered in my medical decision making (see chart for details).  Viral illness  Vital signs are stable and patient is in no signs of distress nontoxic-appearing, musculoskeletal pain is not reproducible on exam, no fever present, did take Tylenol prior to visit, reviewed ED visit, CMP and CBC within normal limits, COVID testing negative, repeating COVID and CBC today, advised to continue use of over-the-counter analgesics as they have been effective, advised to continue to monitor, low suspicion for meningitis or more serious cause, watch wait antibiotic placed at pharmacy if no improvement seen in 7 days Final Clinical Impressions(s) / UC Diagnoses   Final diagnoses:  None   Discharge Instructions   None    ED Prescriptions   None    PDMP not  reviewed this encounter.   Valinda Hoar, NP 03/12/23 1052

## 2023-03-12 NOTE — ED Triage Notes (Signed)
Pt reports on and fever 100.7 F x 3 days heart rate high when has fever. Taking Tylenol and ibuprofen. Reports negative COVID test at the ED.

## 2023-03-12 NOTE — Discharge Instructions (Signed)
Your symptoms today are most likely being caused by a virus and should steadily improve in time it can take up to 7 to 10 days before you truly start to see a turnaround however things will get better if you not have not seen any improvement in your symptoms by Friday you may pick up Augmentin from the pharmacy and begin use  We are rechecking your blood levels and your COVID testing, you will be notified of any concerning values, it is possible that your Cherolyn Behrle blood cell count may be slightly elevated as there is a germ present in your body, it is only concerning if there is a significant elevation at that time we will prompt you to go back to the emergency department for further evaluation  If positive for COVID the current quarantine guidelines are until 24 hours without fever then you may continue activity wearing mask until you no longer have symptoms    You can take Tylenol and/or Ibuprofen as needed for fever reduction and pain relief.    It is important to stay hydrated: drink plenty of fluids (water, gatorade/powerade/pedialyte, juices, or teas) to keep your throat moisturized and help further relieve irritation/discomfort.

## 2023-03-13 ENCOUNTER — Ambulatory Visit: Payer: Managed Care, Other (non HMO)

## 2023-03-13 LAB — CBC
Hematocrit: 41.3 % (ref 34.0–46.6)
Hemoglobin: 13.7 g/dL (ref 11.1–15.9)
MCH: 29.8 pg (ref 26.6–33.0)
MCHC: 33.2 g/dL (ref 31.5–35.7)
MCV: 90 fL (ref 79–97)
Platelets: 209 10*3/uL (ref 150–450)
RBC: 4.59 x10E6/uL (ref 3.77–5.28)
RDW: 13.9 % (ref 11.7–15.4)
WBC: 4.9 10*3/uL (ref 3.4–10.8)

## 2023-03-13 LAB — SARS CORONAVIRUS 2 (TAT 6-24 HRS): SARS Coronavirus 2: NEGATIVE

## 2023-03-16 ENCOUNTER — Other Ambulatory Visit: Payer: Self-pay

## 2023-03-16 DIAGNOSIS — R509 Fever, unspecified: Secondary | ICD-10-CM

## 2023-03-16 DIAGNOSIS — R000 Tachycardia, unspecified: Secondary | ICD-10-CM

## 2023-03-16 DIAGNOSIS — R630 Anorexia: Secondary | ICD-10-CM

## 2023-03-17 ENCOUNTER — Other Ambulatory Visit: Payer: Self-pay

## 2023-03-17 DIAGNOSIS — R000 Tachycardia, unspecified: Secondary | ICD-10-CM

## 2023-03-17 DIAGNOSIS — R630 Anorexia: Secondary | ICD-10-CM

## 2023-03-17 DIAGNOSIS — R509 Fever, unspecified: Secondary | ICD-10-CM

## 2023-03-21 ENCOUNTER — Other Ambulatory Visit: Payer: Self-pay

## 2023-03-21 ENCOUNTER — Ambulatory Visit: Admission: RE | Admit: 2023-03-21 | Payer: 59 | Source: Ambulatory Visit

## 2023-03-21 ENCOUNTER — Other Ambulatory Visit: Payer: 59

## 2023-03-21 ENCOUNTER — Ambulatory Visit
Admission: RE | Admit: 2023-03-21 | Discharge: 2023-03-21 | Disposition: A | Discharge status: Home / Self Care | Payer: 59 | Source: Ambulatory Visit

## 2023-03-21 DIAGNOSIS — R630 Anorexia: Secondary | ICD-10-CM

## 2023-03-21 DIAGNOSIS — R509 Fever, unspecified: Secondary | ICD-10-CM

## 2023-03-21 DIAGNOSIS — R000 Tachycardia, unspecified: Secondary | ICD-10-CM

## 2023-03-21 MED ORDER — IOPAMIDOL (ISOVUE-300) INJECTION 61%
100.0000 mL | Freq: Once | INTRAVENOUS | Status: AC | PRN
  Administered 2023-03-21: 100 mL via INTRAVENOUS

## 2023-03-22 ENCOUNTER — Encounter (HOSPITAL_COMMUNITY): Payer: Self-pay | Admitting: Emergency Medicine

## 2023-03-22 ENCOUNTER — Other Ambulatory Visit: Payer: Self-pay

## 2023-03-22 ENCOUNTER — Emergency Department (HOSPITAL_COMMUNITY)
Admission: EM | Admit: 2023-03-22 | Discharge: 2023-03-22 | Disposition: A | Discharge status: Home / Self Care | Payer: 59 | Attending: Emergency Medicine | Admitting: Emergency Medicine

## 2023-03-22 DIAGNOSIS — K529 Noninfective gastroenteritis and colitis, unspecified: Secondary | ICD-10-CM | POA: Insufficient documentation

## 2023-03-22 DIAGNOSIS — R7989 Other specified abnormal findings of blood chemistry: Secondary | ICD-10-CM | POA: Insufficient documentation

## 2023-03-22 DIAGNOSIS — N1330 Unspecified hydronephrosis: Secondary | ICD-10-CM | POA: Insufficient documentation

## 2023-03-22 DIAGNOSIS — R197 Diarrhea, unspecified: Secondary | ICD-10-CM | POA: Diagnosis present

## 2023-03-22 LAB — URINALYSIS, ROUTINE W REFLEX MICROSCOPIC
Bilirubin Urine: NEGATIVE
Glucose, UA: NEGATIVE mg/dL
Ketones, ur: NEGATIVE mg/dL
Leukocytes,Ua: NEGATIVE
Nitrite: NEGATIVE
Protein, ur: NEGATIVE mg/dL
Specific Gravity, Urine: 1.015 (ref 1.005–1.030)
pH: 6 (ref 5.0–8.0)

## 2023-03-22 LAB — COMPREHENSIVE METABOLIC PANEL
ALT: 86 U/L — ABNORMAL HIGH (ref 0–44)
AST: 54 U/L — ABNORMAL HIGH (ref 15–41)
Albumin: 3.8 g/dL (ref 3.5–5.0)
Alkaline Phosphatase: 128 U/L — ABNORMAL HIGH (ref 38–126)
Anion gap: 11 (ref 5–15)
BUN: 6 mg/dL (ref 6–20)
CO2: 24 mmol/L (ref 22–32)
Calcium: 8.7 mg/dL — ABNORMAL LOW (ref 8.9–10.3)
Chloride: 99 mmol/L (ref 98–111)
Creatinine, Ser: 0.69 mg/dL (ref 0.44–1.00)
GFR, Estimated: >60 mL/min (ref >60)
Glucose, Bld: 85 mg/dL (ref 70–99)
Potassium: 3.7 mmol/L (ref 3.5–5.1)
Sodium: 134 mmol/L — ABNORMAL LOW (ref 135–145)
Total Bilirubin: 0.4 mg/dL (ref 0.3–1.2)
Total Protein: 8.1 g/dL (ref 6.5–8.1)

## 2023-03-22 LAB — CBC
HCT: 39.5 % (ref 36.0–46.0)
Hemoglobin: 13.1 g/dL (ref 12.0–15.0)
MCH: 28.4 pg (ref 26.0–34.0)
MCHC: 33.2 g/dL (ref 30.0–36.0)
MCV: 85.7 fL (ref 80.0–100.0)
Platelets: 299 10*3/uL (ref 150–400)
RBC: 4.61 MIL/uL (ref 3.87–5.11)
RDW: 13.9 % (ref 11.5–15.5)
WBC: 7.3 10*3/uL (ref 4.0–10.5)
nRBC: 0 % (ref 0.0–0.2)

## 2023-03-22 LAB — HCG, SERUM, QUALITATIVE: Preg, Serum: NEGATIVE

## 2023-03-22 LAB — LIPASE, BLOOD: Lipase: 40 U/L (ref 11–51)

## 2023-03-22 MED ORDER — IBUPROFEN 400 MG PO TABS
600.0000 mg | ORAL_TABLET | Freq: Once | ORAL | Status: AC
  Administered 2023-03-22: 600 mg via ORAL
  Filled 2023-03-22: qty 1

## 2023-03-22 MED ORDER — AMOXICILLIN-POT CLAVULANATE 875-125 MG PO TABS: 1.0000 | ORAL_TABLET | Freq: Two times a day (BID) | ORAL | 0 refills | Status: AC

## 2023-03-22 NOTE — ED Triage Notes (Signed)
Pt via POV after her doctor called to tell her to come in based on recent CT result. Pt has had a fever since July 11 and CT showed inflamed colon, elevated liver enzymes, free fluid in abdomen and around lungs. Pt denies pain and has no other physical complaints.

## 2023-03-22 NOTE — ED Provider Notes (Signed)
[facility] AT [facility] Provider Note   CSN: [id number] Arrival date & time: 03/22/23  1331     History  Chief Complaint  Patient presents with   Abnormal Lab    Amber Murillo is a 27 y.o. female.   Abnormal Lab Patient has had fevers on and off for around 2 weeks.  Had been seen in the ER and by urgent care.  Had headaches initially.  Has had some diarrhea which is not unusual with her irritable bowel.  Had CT scan by PCP and reported blood work.  CT scan showed colitis and sent in emergently to ER.  No real pain at this time.  Diarrhea.  States she still gets some fevers.  Afebrile here.  Had been given prescription for Augmentin previously but had not filled it.     Home Medications Prior to Admission medications   Medication Sig Start Date End Date Taking? Authorizing Provider  amoxicillin-clavulanate (AUGMENTIN) 875-125 MG tablet Take 1 tablet by mouth every 12 (twelve) hours. 03/22/23  Yes Benjiman Core, MD  acetaminophen (TYLENOL) 325 MG tablet Take 2 tablets (650 mg total) by mouth every 6 (six) hours as needed (for pain scale < 4). 03/25/22   Harold Hedge, MD  amphetamine-dextroamphetamine (ADDERALL) 10 MG tablet Take 10 mg by mouth daily at 6 (six) AM.    [provider]  azithromycin (ZITHROMAX) 250 MG tablet Take by mouth. 09/27/22   [provider]  cetirizine (ZYRTEC) 10 MG tablet Take 10 mg by mouth daily.    [provider]  Emollient (COLLAGEN) CREA collagen    [provider]  EPINEPHrine 0.3 mg/0.3 mL IJ SOAJ injection Inject 0.3 mg into the muscle as needed for anaphylaxis. 03/30/21   Mayer Masker, PA-C  fluticasone (FLONASE) 50 MCG/ACT nasal spray Place 1 spray into both nostrils daily. 12/23/17   [provider]  ibuprofen (ADVIL) 600 MG tablet Take 1 tablet (600 mg total) by mouth every 6 (six) hours as needed. 03/25/22   Harold Hedge, MD  levothyroxine (SYNTHROID) 112 MCG  tablet Take 112 mcg by mouth every morning.    [provider]  levothyroxine (SYNTHROID) 75 MCG tablet Take 100 mcg by mouth daily. 01/06/20   [provider]  lidocaine (LIDODERM) 5 % Place 1 patch onto the skin daily. Remove & Discard patch within 12 hours or as directed by MD 03/10/23   Roemhildt, Lorin T, PA-C  nystatin (MYCOSTATIN/NYSTOP) powder Apply 1 Application topically 2 (two) times daily.    [provider]  nystatin cream (MYCOSTATIN) Apply 1 Application topically 2 (two) times daily.    [provider]  nystatin-triamcinolone (MYCOLOG II) cream Apply 1 Application topically 2 (two) times daily.    [provider]  Omega-3 Fatty Acids (OMEGA 3 PO) Omega 3    [provider]  predniSONE (DELTASONE) 10 MG tablet Take 40 mg by mouth every morning.    [provider]  Prenatal Vit-DSS-Fe Cbn-FA (PRENATAL AD PO) Prenatal    [provider]  Prenatal Vit-Fe Fumarate-FA (PRENATAL MULTIVITAMIN) TABS tablet Take 1 tablet by mouth daily at 12 noon.    [provider]  Probiotic Product (PROBIOTIC BLEND PO) Probiotic    [provider]  triamcinolone cream (KENALOG) 0.1 % Apply 1 Application topically 2 (two) times daily.    [provider]      Allergies    Patient has no known allergies.    Review of  Systems   Review of Systems  Physical Exam Updated Vital Signs BP 120/76   Pulse 95   Temp 99.1 F (37.3 C) (Oral)   Resp 16   Ht 5\' 1"  (1.549 m)   Wt 68 kg   SpO2 98%   BMI 28.34 kg/m  Physical Exam Vitals and nursing note reviewed.  Cardiovascular:     Rate and Rhythm: Regular rhythm.  Abdominal:     Tenderness: There is no abdominal tenderness.  Musculoskeletal:        General: Normal range of motion.  Skin:    Capillary Refill: Capillary refill takes less than 2 seconds.  Neurological:     Mental Status: She is alert and oriented to person, place, and time.     ED  Results / Procedures / Treatments   Labs (all labs ordered are listed, but only abnormal results are displayed) Labs Reviewed  COMPREHENSIVE METABOLIC PANEL - Abnormal; Notable for the following components:      Result Value   Sodium 134 (*)    Calcium 8.7 (*)    AST 54 (*)    ALT 86 (*)    Alkaline Phosphatase 128 (*)    All other components within normal limits  URINALYSIS, ROUTINE W REFLEX MICROSCOPIC - Abnormal; Notable for the following components:   Hgb urine dipstick MODERATE (*)    Bacteria, UA RARE (*)    All other components within normal limits  LIPASE, BLOOD  CBC  HCG, SERUM, QUALITATIVE    EKG None  Radiology CT CHEST ABDOMEN PELVIS W CONTRAST  Result Date: 03/21/2023 CLINICAL DATA:  Unexplained fever, decreased appetite. EXAM: CT CHEST, ABDOMEN, AND PELVIS WITH CONTRAST TECHNIQUE: Multidetector CT imaging of the chest, abdomen and pelvis was performed following the standard protocol during bolus administration of intravenous contrast. RADIATION DOSE REDUCTION: This exam was performed according to the departmental dose-optimization program which includes automated exposure control, adjustment of the mA and/or kV according to patient size and/or use of iterative reconstruction technique. CONTRAST:  ISOVUE-300 IOPAMIDOL (ISOVUE-300) INJECTION 61% COMPARISON:  CT August 16, 2016 and ultrasound August 27, 2016 FINDINGS: CT CHEST FINDINGS Cardiovascular: Normal caliber thoracic aorta. No central pulmonary embolus on this nondedicated study. Normal size heart. No significant pericardial effusion/thickening. Mediastinum/Nodes: No suspicious thyroid nodule. No pathologically enlarged mediastinal, hilar or axillary lymph nodes. The esophagus is grossly unremarkable. Feathery soft tissue in the anterior mediastinum which conforms to underlying vasculature on image 15/2 is stable from August 16, 2016 and compatible with remnant thymic tissue. Lungs/Pleura: Small bilateral  pleural effusions with adjacent relaxation atelectasis no suspicious pulmonary nodules or masses. Musculoskeletal: No aggressive lytic or blastic lesion of bone. No acute osseous abnormality CT ABDOMEN PELVIS FINDINGS Hepatobiliary: No suspicious hepatic lesion. Gallbladder is unremarkable. No biliary ductal dilation Pancreas: No pancreatic ductal dilation or evidence of acute inflammation Spleen: Spleen measures upper limits of normal at 13.3 cm in maximum craniocaudal dimension Adrenals/Urinary Tract: No suspicious adrenal nodule or mass. Mild right hydroureteronephrosis to the level of the pelvis without urolithiasis or discrete obstructive etiology. No left hydronephrosis. Kidneys demonstrate symmetric enhancement. Stomach/Bowel: Radiopaque enteric contrast material traverses the rectum. Stomach is unremarkable for degree of distension. No pathologic dilation of small or large bowel normal appendix. Long segment wall thickening of the distal transverse colon through the sigmoid colon with adjacent inflammatory stranding. Wall thickening of the terminal ileum with adjacent inflammatory stranding. Vascular/Lymphatic: Normal caliber abdominal aorta. Smooth IVC contours. The portal, splenic and superior mesenteric veins  are patent. Prominent retroperitoneal and mesenteric lymph nodes for instance a lymph node in the jejunal mesentery measures 7 mm in short axis on image 62/2. Reproductive: Intrauterine device in place. Other: Small volume pelvic free fluid may be reactive or physiologic. Musculoskeletal: No aggressive lytic or blastic lesion of bone. No acute osseous abnormality. IMPRESSION: 1. Long segment wall thickening of the distal transverse colon through the sigmoid colon with adjacent inflammatory stranding, as well as wall thickening of the terminal ileum with adjacent inflammatory stranding, findings are most compatible with an infectious or inflammatory enterocolitis. 2. Mild right hydroureteronephrosis  to the level of the pelvis without urolithiasis or discrete obstructive etiology, possibly reactive secondary to pelvic free fluid or reflecting urinary tract infection suggest correlation with urinalysis. 3. Small bilateral pleural effusions with adjacent relaxation atelectasis. 4. Prominent retroperitoneal and mesenteric lymph nodes, likely reactive. 5. Spleen measures upper limits of normal. Electronically Signed   By: Maudry Mayhew M.D.   On: 03/21/2023 15:55    Procedures Procedures    Medications Ordered in ED Medications  ibuprofen (ADVIL) tablet 600 mg (600 mg Oral Given 03/22/23 2010)    ED Course/ Medical Decision Making/ A&P                             Medical Decision Making Risk Prescription drug management.   Patient with fever.  Had some hydronephrosis.  No infection.  Also had colitis.  Potential cause of the fever.  Provider was reportedly worried about perforated bowel.  Clinically and radiologically does not have perforated bowel.  Benign abdominal exam.  White count reassuring.  Urine does not show infection.  Discussed with Dr. Loreta Ave, patient has seen Dr. Elnoria Howard previously.  She recommends starting Augmentin.  She can see in the office tomorrow.  No further workup needed at this time.  Discharge home.        Final Clinical Impression(s) / ED Diagnoses Final diagnoses:  Colitis    Rx / DC Orders ED Discharge Orders          Ordered    amoxicillin-clavulanate (AUGMENTIN) 875-125 MG tablet  Every 12 hours        03/22/23 2033              Benjiman Core, MD 03/22/23 2037

## 2023-03-23 ENCOUNTER — Other Ambulatory Visit: Payer: 59

## 2023-04-06 ENCOUNTER — Ambulatory Visit: Payer: 59 | Admitting: Internal Medicine
